# Patient Record
Sex: Male | Born: 1935
Health system: Southern US, Community
[De-identification: ages and names within clinical notes are randomized; demographics above are authoritative.]

## PROBLEM LIST (undated history)

## (undated) DIAGNOSIS — K219 Gastro-esophageal reflux disease without esophagitis: Secondary | ICD-10-CM

## (undated) DIAGNOSIS — I10 Essential (primary) hypertension: Secondary | ICD-10-CM

## (undated) HISTORY — DX: Gastro-esophageal reflux disease without esophagitis: K21.9

## (undated) HISTORY — DX: Essential (primary) hypertension: I10

## (undated) HISTORY — PX: CORONARY ARTERY BYPASS GRAFT: SHX141

---

## 2005-04-26 ENCOUNTER — Ambulatory Visit: Payer: Self-pay | Admitting: Internal Medicine

## 2012-06-27 DIAGNOSIS — I2510 Atherosclerotic heart disease of native coronary artery without angina pectoris: Secondary | ICD-10-CM | POA: Insufficient documentation

## 2013-09-06 DIAGNOSIS — N402 Nodular prostate without lower urinary tract symptoms: Secondary | ICD-10-CM | POA: Insufficient documentation

## 2013-09-06 DIAGNOSIS — H18603 Keratoconus, unspecified, bilateral: Secondary | ICD-10-CM | POA: Insufficient documentation

## 2013-09-06 DIAGNOSIS — H2510 Age-related nuclear cataract, unspecified eye: Secondary | ICD-10-CM | POA: Insufficient documentation

## 2013-12-06 ENCOUNTER — Emergency Department: Payer: Self-pay | Admitting: Emergency Medicine

## 2013-12-06 LAB — COMPREHENSIVE METABOLIC PANEL
Albumin: 3.4 g/dL (ref 3.4–5.0)
Alkaline Phosphatase: 78 U/L
Anion Gap: 6 — ABNORMAL LOW (ref 7–16)
BUN: 31 mg/dL — ABNORMAL HIGH (ref 7–18)
Bilirubin,Total: 0.4 mg/dL (ref 0.2–1.0)
Chloride: 110 mmol/L — ABNORMAL HIGH (ref 98–107)
Co2: 22 mmol/L (ref 21–32)
Creatinine: 1.57 mg/dL — ABNORMAL HIGH (ref 0.60–1.30)
Osmolality: 282 (ref 275–301)
SGOT(AST): 27 U/L (ref 15–37)
SGPT (ALT): 26 U/L (ref 12–78)
Sodium: 138 mmol/L (ref 136–145)
Total Protein: 6.9 g/dL (ref 6.4–8.2)

## 2013-12-06 LAB — CBC
HCT: 43 % (ref 40.0–52.0)
HGB: 14.7 g/dL (ref 13.0–18.0)
MCHC: 34.1 g/dL (ref 32.0–36.0)
MCV: 95 fL (ref 80–100)
Platelet: 147 10*3/uL — ABNORMAL LOW (ref 150–440)
RDW: 12.6 % (ref 11.5–14.5)
WBC: 7 10*3/uL (ref 3.8–10.6)

## 2013-12-06 LAB — TROPONIN I: Troponin-I: 0.08 ng/mL — ABNORMAL HIGH

## 2013-12-06 LAB — APTT: Activated PTT: 27.5 secs (ref 23.6–35.9)

## 2013-12-06 LAB — HEMOGLOBIN A1C: HEMOGLOBIN A1C: 5.4

## 2013-12-07 LAB — LIPID PANEL
Cholesterol: 131 mg/dL (ref 0–200)
HDL Cholesterol: 50 mg/dL (ref 40–60)
Ldl Cholesterol, Calc: 40 mg/dL (ref 0–100)
Triglycerides: 204 mg/dL — ABNORMAL HIGH (ref 0–200)
VLDL Cholesterol, Calc: 41 mg/dL — ABNORMAL HIGH (ref 5–40)

## 2014-09-09 DIAGNOSIS — N400 Enlarged prostate without lower urinary tract symptoms: Secondary | ICD-10-CM | POA: Insufficient documentation

## 2014-11-05 LAB — LIPID PANEL
CHOLESTEROL: 156 mg/dL (ref 0–200)
HDL: 51 mg/dL (ref 35–70)
LDL Cholesterol: 83 mg/dL
LDl/HDL Ratio: 1.6
Triglycerides: 111 mg/dL (ref 40–160)

## 2014-11-05 LAB — HEPATIC FUNCTION PANEL
ALK PHOS: 79 U/L (ref 25–125)
ALT: 12 U/L (ref 10–40)
AST: 17 U/L (ref 14–40)
Bilirubin, Total: 0.4 mg/dL

## 2014-11-05 LAB — PSA: PSA: 0.9

## 2014-11-05 LAB — TSH: TSH: 1.84 u[IU]/mL (ref <=5.90)

## 2015-01-15 LAB — CBC AND DIFFERENTIAL
HEMATOCRIT: 41 % (ref 41–53)
Hemoglobin: 13.6 g/dL (ref 13.5–17.5)
NEUTROS ABS: 4 /uL
Platelets: 181 10*3/uL (ref 150–399)
WBC: 6 10*3/mL

## 2015-01-16 ENCOUNTER — Ambulatory Visit: Payer: Self-pay | Admitting: Family Medicine

## 2015-01-20 LAB — BASIC METABOLIC PANEL
BUN: 23 mg/dL — AB (ref 4–21)
Creatinine: 1.8 mg/dL — AB (ref 0.6–1.3)
GLUCOSE: 101 mg/dL
Potassium: 5 mmol/L (ref 3.4–5.3)
Sodium: 140 mmol/L (ref 137–147)

## 2015-04-04 NOTE — H&P (Signed)
PATIENT NAME:  Eric Mccall, Eric Mccall MR#:  [id number] DATE OF BIRTH:  Oct 30, 1934  DATE OF ADMISSION:  12/06/2013  CHIEF COMPLAINT:  A 81 year old gentleman with chief complaint of chest pain.   REFERRING PHYSICIAN: Dorothea GlassmanPaul Malinda.   PRIMARY CARE PHYSICIAN: Dr. Julieanne Mansonichard Gilbert[facility]   HISTORY OF PRESENT ILLNESS: This is a very nice 81 year old gentleman with history of coronary artery disease, hyperlipidemia, hypertension, previous triple A operated on who comes with a history of having significant chest pain that started at 11:00 a.m. in the morning while resting. The pain lasted for 2 hours. He took 2 aspirin full strength and the pain started to ease up after 1 hour. It started as an intensity of 5/10 located in the middle of the chest, very intense for 1 hour; then after 1 hour started to get better.  But whenever the patient got up and walked around, the pain got really severe again and went away after 2 hours. The patient had radiation of the pain on both arms and it was described as crushing, heaviness in the middle of the chest, not association with sensation of nausea, cryo -diaphoresis or shortness of breath. The patient was actually having this exact same pain 12 years ago before his CABG. Whenever he had his coronary artery bypass graft, he did not have a MI. He had stable angina with exertion, and he has not had any angina since his CABG. The patient is having elevation of the J-point of 1 mm and a T wave that looks hyperacute on 3 and 4. The patient also has depression of ST segment in V5 and all these changes were improved on the second EKG. The patient is now asymptomatic, but his first troponin is positive 0.06. The patient is admitted for evaluation and treatment of acute coronary syndrome.   CONSTITUTIONAL: No fever, fatigue or weakness. EYES: No blurry vision or double vision.    ENT: No difficulty swallowing or tinnitus.  RESPIRATORY: No cough or wheezing or COPD.   CARDIOVASCULAR: No chest pain or orthopnea.  GASTROINTESTINAL: No nausea, vomiting or diarrhea.  GENITOURINARY: No dysuria or hematuria.  ENDOCRINE: No polyuria, polydipsia or polyphagia. HEMATOLOGIC AND LYMPHATIC: No anemia, easy bruising or bleeding.  SKIN: No rashes or petechiae.  MUSCULOSKELETAL: No significant neck pain or back pain.  NEUROLOGIC: No numbness or tingling.  PSYCHIATRIC: No insomnia or depression.   PAST MEDICAL HISTORY: 1.  Coronary artery disease.  2.  Hyperlipidemia.  3.  Hypertension.  4.  Aortic abdominal aneurysm, status post repair.  5.  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   ALLERGIES: No known drug allergies.   PAST SURGICAL HISTORY:  Cataract x 2, coronary artery bypass graft 12 years ago, aortic abdominal aneurysm repair.   FAMILY HISTORY: Positive for coronary artery disease and MI in his father at the age of 79. No CHF. Cancer of the breasts in his sister and his mom had some cancer that he is not aware where it was located.   SOCIAL HISTORY: He has never smoked. He does not drink alcohol. He is retired. 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   MEDICATIONS: Altace 2.5 mg daily, Zocor 40 mg daily, aspirin 81 mg daily, glucosamine as needed for knee pain occasionally.   PHYSICAL EXAMINATION: VITAL SIGNS: Blood pressure 130/68, pulse 61, respiratory rate of  15, temperature 98, O2 saturation 98% on room air.  Pulse now  is 60. GENERAL:  Alert and oriented x 3, in no acute distress. No respiratory distress. Hemodynamically stable.  HEENT: Pupils are equal and reactive. Extraocular movements are intact. Mucosa moist. Anicteric sclerae. Pink conjunctivae. No oral lesions. No oropharyngeal exudates.  NECK: Supple. No JVD. No thyromegaly. No adenopathy. No carotid bruits.  CARDIOVASCULAR: Regular rate and rhythm. No murmurs, rubs or gallops are appreciated. No displacement of PMI.  LUNGS: Clear  without any wheezing or crepitus. No use of accessory muscles.  ABDOMEN: Soft, nontender, nondistended. No hepatosplenomegaly. No masses. Bowel sounds are positive.  EXTREMITIES: No edema, cyanosis or clubbing.  VASCULAR: Pulses +2. Capillary refill less than 3.  SKIN: No rashes or petechiae.  LYMPHATIC: Negative for lymphadenopathy in the neck or supraclavicular area.  NEUROLOGIC: Cranial nerves II through XII intact. No focal findings.  PSYCHIATRIC: No agitation or depression.  LABORATORY, DIAGNOSTIC AND RADIOLOGICAL DATA RESULTS: EKG changes as mentioned above. X-ray underlying emphysema.  No edema or consolidations, although the patient does not have a history of smoking. These changes might be just related to normal body habitus.  Glucose 92, BUN 31, creatinine 1.57, sodium 138, potassium 3.8, chloride 110. LFTs within normal limits. Troponin 0.06. White count is 7, hemoglobin 14, platelet count 147.   ASSESSMENT AND PLAN: This is a 81 year old gentleman with history of coronary artery disease, hyperlipidemia, hypertension, aortic abdominal aneurysm comes with chest pain lasting for 2 hours.  1.  Acute coronary syndrome. The patient comes with chest pain, which is characteristic similar than the one that he had 12 years ago when he was diagnosed with coronary artery disease. The pain improved after aspirin. There were some EKG changes mentioned above that actually reverse after the pain was gone and his first set of troponin is slightly elevated at 0.06, happened 2 to 3 hours after the pain began. The patient is admitted for acute coronary syndrome. He is on a heparin drip. He is bradycardic in the 40s for which beta blocker will be contraindicated. The patient has been given aspirin and nitroglycerin and at this moment he is symptomatic. Again, his symptoms resolved and his EKG changes resolved. I spoke with Dr. Juliann Pares who tells me that he is going to order a catheterization for the morning  when the patient gets informed of this he says that he prefers to go to [facility], as his cardiologist is there. 2.  The patient is going to be possibly transferred from the ER. If there are no beds we will be happy to admit him and continue with the treatment with heparin, morphine, oxygen, nitroglycerin and aspirin, and monitor closely as needed. If the patient gets admitted, we can order an echocardiogram, but we will defer that to cardiology if they want to do catheterization.  3.  As far as his hypertension, it seems to be stable. Continue treatment with Altace. 4.  As far as hyperlipidemia, continue Zocor. 5.  As far as his increased creatinine, we do not have a baseline but this seems to be chronic stage II. 6.  Other medical problems seem to be stable. 7.  Deep vein thrombosis prophylaxis with a heparin drip. 8. Gastrointestinal prophylaxis. We will put him on proton pump inhibitor. We will defer treatment if the patient gets transferred to [facility].  I spent about 50 minutes with this patient.    ____________________________ Felipa Furnace, MD rsg:ce D: 12/06/2013 16:23:10 ET T: 12/06/2013 18:26:08 ET JOB#: [id number]  cc: Felipa Furnace, MD, <Dictator> Murry Diaz SANCHEZ GUTIERRE  MD ELECTRONICALLY SIGNED 12/10/2013 0:38

## 2015-06-16 IMAGING — CR DG CHEST 1V PORT
1 series · 2 of 2 positions shown · non-contrast
Comparison: None.

CLINICAL DATA: Chest pain

EXAM:
PORTABLE CHEST - 1 VIEW

[Series 1: ap · 0.17mm/px · 2 of 2 slices shown]
[im 1/2]
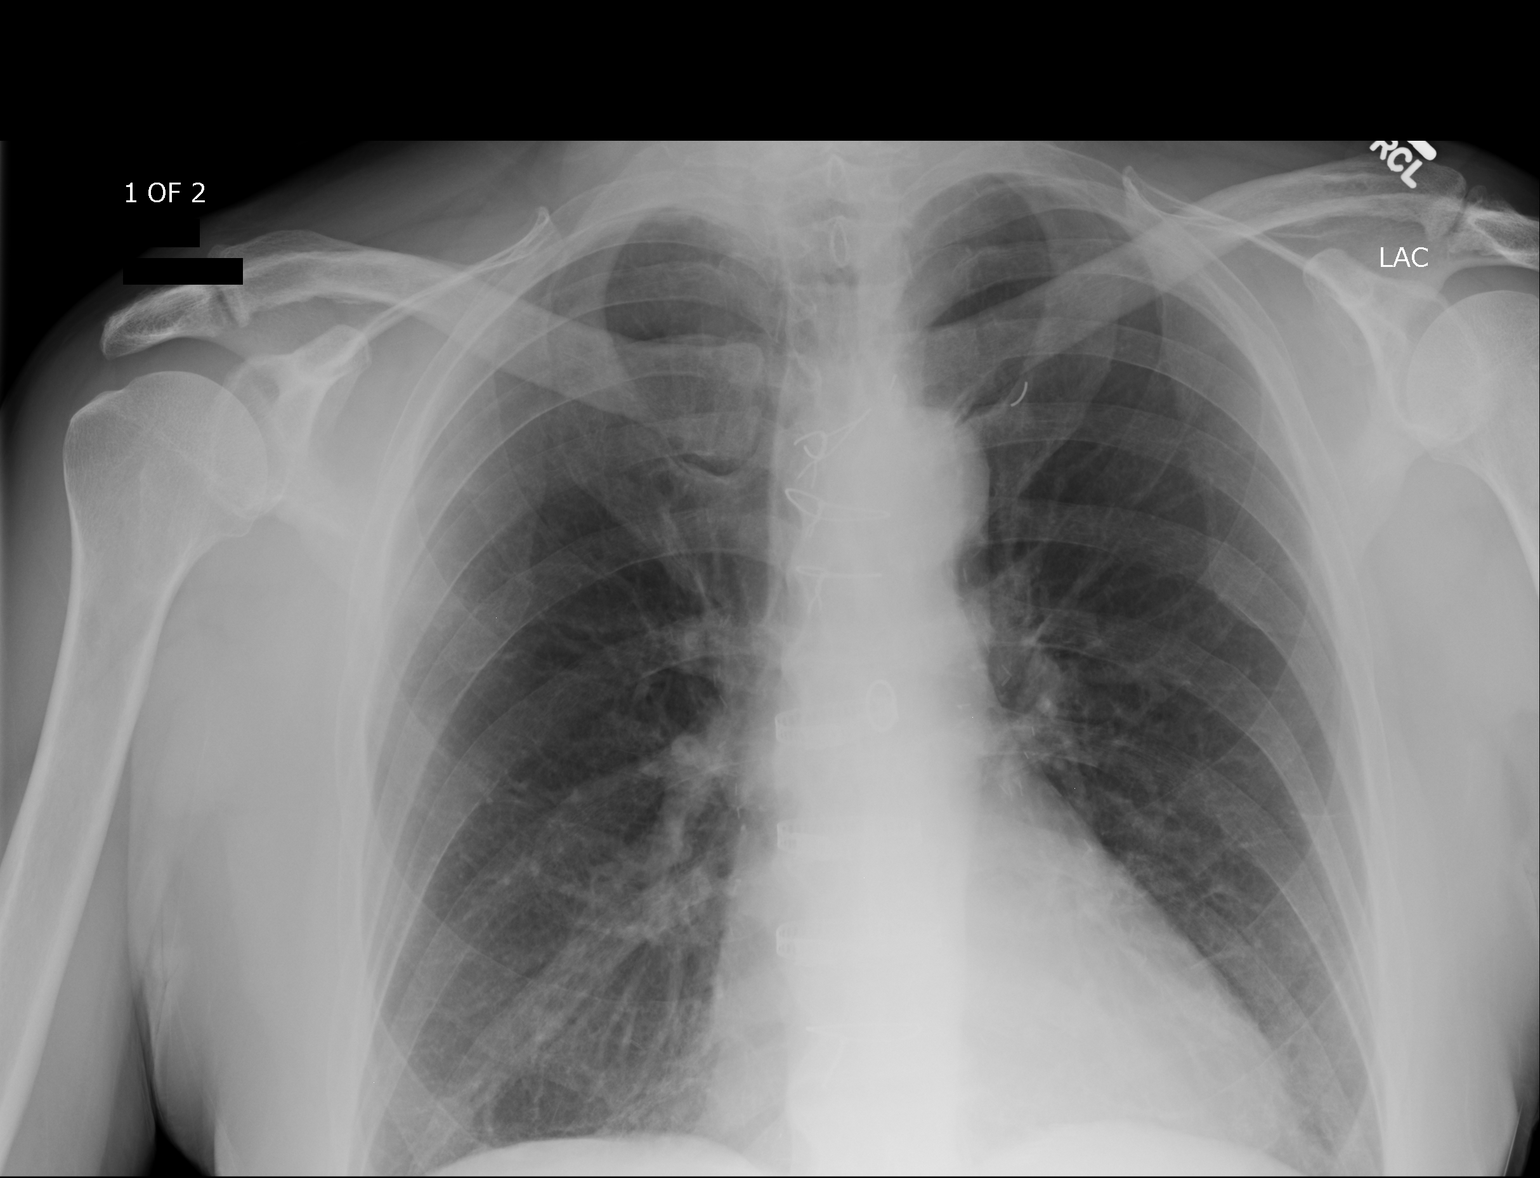
[im 2/2]
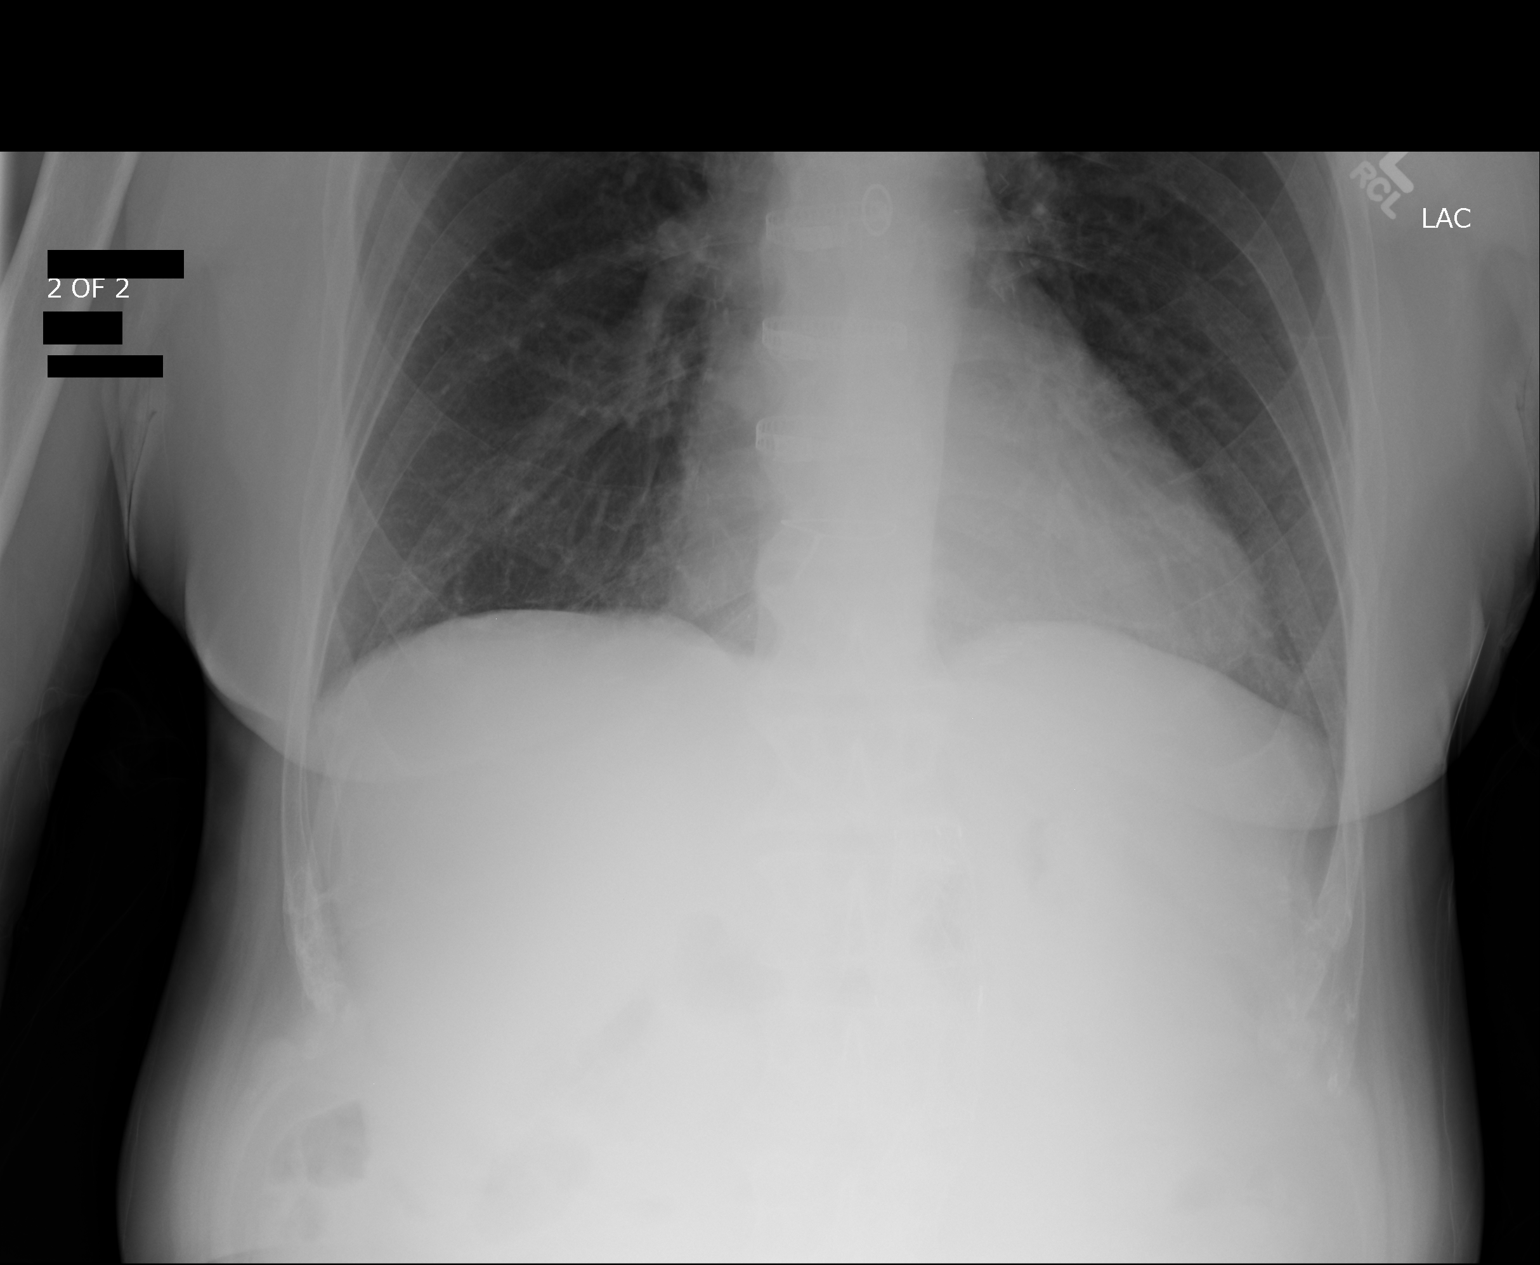

[2 of 2 positions shown; findings below may reference images not displayed]

FINDINGS: There is underlying emphysematous change. There is no edema or
consolidation. Heart is mildly enlarged. The pulmonary vascularity
reflects underlying emphysema. No adenopathy. Patient is status post
coronary artery bypass grafting. The superior most sternal wire is
fractured. No pneumothorax.
IMPRESSION: Underlying emphysema. No edema or consolidation. Mild cardiac
enlargement.

## 2015-07-07 DIAGNOSIS — N183 Chronic kidney disease, stage 3 unspecified: Secondary | ICD-10-CM | POA: Insufficient documentation

## 2015-07-07 DIAGNOSIS — I10 Essential (primary) hypertension: Secondary | ICD-10-CM | POA: Insufficient documentation

## 2015-07-07 LAB — BASIC METABOLIC PANEL
BUN: 22 mg/dL — AB (ref 4–21)
Glucose: 82 mg/dL
POTASSIUM: 4.5 mmol/L (ref 3.4–5.3)
SODIUM: 139 mmol/L (ref 137–147)

## 2015-08-06 LAB — BASIC METABOLIC PANEL: CREATININE: 2.3 mg/dL — AB (ref <=1.3)

## 2015-09-25 ENCOUNTER — Ambulatory Visit (INDEPENDENT_AMBULATORY_CARE_PROVIDER_SITE_OTHER): Payer: Medicare Other

## 2015-09-25 DIAGNOSIS — Z23 Encounter for immunization: Secondary | ICD-10-CM | POA: Diagnosis not present

## 2015-11-12 LAB — LIPID PANEL
CHOLESTEROL: 177 mg/dL (ref 0–200)
HDL: 40 mg/dL (ref 35–70)
LDL Cholesterol: 104 mg/dL
TRIGLYCERIDES: 163 mg/dL — AB (ref 40–160)

## 2015-11-13 DIAGNOSIS — I714 Abdominal aortic aneurysm, without rupture, unspecified: Secondary | ICD-10-CM | POA: Insufficient documentation

## 2015-11-13 DIAGNOSIS — IMO0002 Reserved for concepts with insufficient information to code with codable children: Secondary | ICD-10-CM | POA: Insufficient documentation

## 2015-11-13 DIAGNOSIS — E785 Hyperlipidemia, unspecified: Secondary | ICD-10-CM | POA: Insufficient documentation

## 2015-11-13 DIAGNOSIS — I259 Chronic ischemic heart disease, unspecified: Secondary | ICD-10-CM | POA: Insufficient documentation

## 2015-11-13 DIAGNOSIS — I130 Hypertensive heart and chronic kidney disease with heart failure and stage 1 through stage 4 chronic kidney disease, or unspecified chronic kidney disease: Secondary | ICD-10-CM | POA: Insufficient documentation

## 2015-11-13 DIAGNOSIS — I499 Cardiac arrhythmia, unspecified: Secondary | ICD-10-CM | POA: Insufficient documentation

## 2015-11-20 ENCOUNTER — Ambulatory Visit (INDEPENDENT_AMBULATORY_CARE_PROVIDER_SITE_OTHER): Payer: Medicare Other | Admitting: Family Medicine

## 2015-11-20 ENCOUNTER — Encounter: Payer: Self-pay | Admitting: Family Medicine

## 2015-11-20 VITALS — BP 118/60 | HR 50 | Temp 97.6°F | Resp 16 | Ht 70.0 in | Wt 171.0 lb

## 2015-11-20 DIAGNOSIS — Z0000 Encounter for general adult medical examination without abnormal findings: Secondary | ICD-10-CM | POA: Diagnosis not present

## 2015-11-20 NOTE — Progress Notes (Signed)
Patient ID: Eric Mccall, male   DOB: 03-22-1934, 81 y.o.   MRN: [id number] Patient: Eric Mccall, Male    DOB: 03-22-1934, 81 y.o.   MRN: [id number] Visit Date: 11/20/2015  Today's Provider: Megan Mansichard Katalaya Beel Jr, MD   Chief Complaint  Patient presents with  . Annual Exam   Subjective:   Eric AmberCharles L Lintner is a 81 y.o. male who presents today for his Subsequent Annual Wellness Visit. He feels well. He reports exercising. Does a lot of yard work. He reports he is sleeping well.  Colonoscopy-04/26/05 Diverticulosis EKG- 02/21/13 Td- 05/23/12 Pneumo 23- 10/21/04 Prevnar- 11/05/14 Zoster- 08/26/11  Review of Systems  Constitutional: Negative.   HENT: Negative.   Eyes: Negative.   Respiratory: Negative.   Cardiovascular: Negative.   Gastrointestinal: Negative.   Endocrine: Negative.   Genitourinary: Negative.   Musculoskeletal: Negative.   Skin: Negative.   Allergic/Immunologic: Negative.   Neurological: Negative.   Hematological: Negative.   Psychiatric/Behavioral: Negative.     Patient Active Problem List   Diagnosis Date Noted  . Abdominal aortic aneurysm (AAA) (HCC) 11/13/2015  . HLD (hyperlipidemia) 11/13/2015  . Heart & renal disease, hypertensive, with heart failure (HCC) 11/13/2015  . Irregular cardiac rhythm 11/13/2015  . Cardiac ischemia 11/13/2015  . Cataract, nuclear 11/13/2015  . Chronic kidney disease (CKD), stage III (moderate) 07/07/2015  . Essential (primary) hypertension 07/07/2015  . Benign fibroma of prostate 09/09/2014  . NS (nuclear sclerosis) 09/06/2013  . Arteriosclerosis of coronary artery 06/27/2012    Social History   Social History  . Marital Status: Married    Spouse Name: N/A  . Number of Children: N/A  . Years of Education: N/A   Occupational History  . Not on file.   Social History Main Topics  . Smoking status: Not on file  . Smokeless tobacco: Not on file  . Alcohol Use: Not on file  . Drug Use: Not on file  . Sexual  Activity: Not on file   Other Topics Concern  . Not on file   Social History Narrative  . No narrative on file    Past Surgical History  Procedure Laterality Date  . Coronary artery bypass graft      His family history includes Breast cancer in his sister; Cancer in his mother; Depression in his mother; Heart attack in his father; Heart disease in his father.    Outpatient Prescriptions Prior to Visit  Medication Sig Dispense Refill  . amLODipine (NORVASC) 5 MG tablet Take by mouth.    Marland Kitchen. aspirin 81 MG tablet Take by mouth.    . chlorproMAZINE (THORAZINE) 10 MG tablet Take by mouth.    . clopidogrel (PLAVIX) 75 MG tablet Take by mouth.    . metoprolol succinate (TOPROL-XL) 25 MG 24 hr tablet Take by mouth.    . mometasone (ELOCON) 0.1 % cream MOMETASONE FUROATE, 0.1% (External Cream)  1 (one) Cream Cream apply to affected area daily for 0 days  Quantity: 1;  Refills: 1   Ordered :05-Nov-2014  Dimas ChyleByrd, Brittany ;  Started 05-Nov-2014 Active    . omeprazole (PRILOSEC) 20 MG capsule Take by mouth.    . pantoprazole (PROTONIX) 40 MG tablet Take by mouth.    . ramipril (ALTACE) 2.5 MG capsule Take by mouth.    . simvastatin (ZOCOR) 40 MG tablet Take by mouth.     No facility-administered medications prior to visit.    No Known Allergies  Patient Care Team: Maple Hudsonichard L Miquel Stacks Jr., MD  as PCP - General (Family Medicine)  Objective:   Vitals: There were no vitals filed for this visit.  Physical Exam  Constitutional: He is oriented to person, place, and time. He appears well-developed and well-nourished.  HENT:  Head: Normocephalic and atraumatic.  Right Ear: External ear normal.  Left Ear: External ear normal.  Nose: Nose normal.  Mouth/Throat: Oropharynx is clear and moist.  Eyes: Conjunctivae and EOM are normal. Pupils are equal, round, and reactive to light.  Neck: Normal range of motion. Neck supple.  Cardiovascular: Normal rate, regular rhythm, normal heart sounds and  intact distal pulses.   Pulmonary/Chest: Effort normal and breath sounds normal.  Abdominal: Soft. Bowel sounds are normal.  Genitourinary:  Deferred to urology  Musculoskeletal: Normal range of motion.  Neurological: He is alert and oriented to person, place, and time. He has normal reflexes.  Skin: Skin is warm and dry.  Psychiatric: He has a normal mood and affect. His behavior is normal. Judgment and thought content normal.    Activities of Daily Living No flowsheet data found.  Fall Risk Assessment No flowsheet data found.   Depression Screen No flowsheet data found.  Cognitive Testing - 6-CIT    Year: 0 points  Month: 0 points  Memorize "Floyde Parkins, [address] [place]"  Time (within 1 hour:) 0 points  Count backwards from 20: 0 points  Name months of year: 0 points  Repeat Address: 2 points   Total Score: 2/28  Interpretation : Normal (0-7) Abnormal (8-28)    Assessment & Plan:     Annual Wellness Visit  Reviewed patient's Family Medical History Reviewed and updated list of patient's medical providers Assessment of cognitive impairment was done Assessed patient's functional ability Established a written schedule for health screening services Health Risk Assessent Completed and Reviewed  Exercise Activities and Dietary recommendations Goals    None      Immunization History  Administered Date(s) Administered  . Influenza, High Dose Seasonal PF 09/25/2015  . Pneumococcal Conjugate-13 11/05/2014  . Pneumococcal Polysaccharide-23 10/21/2004  . Td 05/23/2012  . Zoster 08/26/2011    Health Maintenance  Topic Date Due  . INFLUENZA VACCINE  07/13/2016  . TETANUS/TDAP  05/23/2022  . ZOSTAVAX  Completed  . PNA vac Low Risk Adult  Completed      Discussed health benefits of physical activity, and encouraged him to engage in regular exercise appropriate for his age and condition.    Julieanne Manson MD [facility] 11/20/2015 8:27 AM  ------------------------------------------------------------------------------------------------------------

## 2015-11-21 ENCOUNTER — Encounter: Payer: Self-pay | Admitting: Family Medicine

## 2016-03-05 ENCOUNTER — Other Ambulatory Visit: Payer: Self-pay

## 2016-03-05 NOTE — Telephone Encounter (Signed)
Refill request from [facility] came through for Omeprazole but an adverse reaction is coming up with Plavix when i try to send it. Please review-aa

## 2016-03-06 MED ORDER — OMEPRAZOLE 20 MG PO CPDR: 20.0000 mg | DELAYED_RELEASE_CAPSULE | Freq: Every day | ORAL | Status: DC

## 2016-05-04 ENCOUNTER — Other Ambulatory Visit: Payer: Self-pay | Admitting: Family Medicine

## 2016-05-12 DIAGNOSIS — I2510 Atherosclerotic heart disease of native coronary artery without angina pectoris: Secondary | ICD-10-CM | POA: Diagnosis not present

## 2016-05-12 DIAGNOSIS — E782 Mixed hyperlipidemia: Secondary | ICD-10-CM | POA: Diagnosis not present

## 2016-05-12 DIAGNOSIS — I10 Essential (primary) hypertension: Secondary | ICD-10-CM | POA: Diagnosis not present

## 2016-05-24 ENCOUNTER — Ambulatory Visit: Payer: Medicare Other | Admitting: Family Medicine

## 2016-05-24 ENCOUNTER — Ambulatory Visit (INDEPENDENT_AMBULATORY_CARE_PROVIDER_SITE_OTHER): Payer: Medicare Other | Admitting: Family Medicine

## 2016-05-24 VITALS — BP 118/72 | HR 56 | Temp 98.3°F | Resp 14 | Wt 172.0 lb

## 2016-05-24 DIAGNOSIS — I10 Essential (primary) hypertension: Secondary | ICD-10-CM

## 2016-05-24 DIAGNOSIS — E785 Hyperlipidemia, unspecified: Secondary | ICD-10-CM

## 2016-05-24 DIAGNOSIS — I2510 Atherosclerotic heart disease of native coronary artery without angina pectoris: Secondary | ICD-10-CM

## 2016-05-24 DIAGNOSIS — N183 Chronic kidney disease, stage 3 unspecified: Secondary | ICD-10-CM

## 2016-05-24 DIAGNOSIS — R42 Dizziness and giddiness: Secondary | ICD-10-CM | POA: Diagnosis not present

## 2016-05-24 MED ORDER — ATORVASTATIN CALCIUM 40 MG PO TABS: 40.0000 mg | ORAL_TABLET | Freq: Every day | ORAL | Status: DC

## 2016-05-24 NOTE — Progress Notes (Signed)
Patient ID: Eric Mccall, male   DOB: [birth year], 82 y.o.   MRN: [id number]    Subjective:  HPI  Patient is here for follow up.  Hypertension: patient checks her b/p at times and readings are usually around 120s/80s. No cardiac symptoms. BP Readings from Last 3 Encounters:  05/24/16 118/72  11/20/15 118/60  01/20/15 130/72   Hyperlipidemia: His cardiologist switched Zocor to Crestor about 10 days ago, and after that he has noticed that he feels lightheaded all the time, slightly unsteady. Lab Results  Component Value Date   CHOL 177 11/12/2015   HDL 40 11/12/2015   LDLCALC 104 11/12/2015   TRIG 163* 11/12/2015   GERD: takes Omeprazole 1 tablet daily.  Prior to Admission medications   Medication Sig Start Date End Date Taking? Authorizing Provider  amLODipine (NORVASC) 5 MG tablet Take by mouth.    Historical Provider, MD  aspirin 81 MG tablet Take by mouth.    Historical Provider, MD  chlorproMAZINE (THORAZINE) 10 MG tablet Take by mouth. 01/15/15   Historical Provider, MD  clopidogrel (PLAVIX) 75 MG tablet Take by mouth.    Historical Provider, MD  metoprolol succinate (TOPROL-XL) 25 MG 24 hr tablet Take by mouth.    Historical Provider, MD  mometasone (ELOCON) 0.1 % cream APPLY TO AFFECTED AREA DAILY 05/04/16   Lorie Phenix, MD  omeprazole (PRILOSEC) 20 MG capsule Take 1 capsule (20 mg total) by mouth daily. 03/06/16   Hollan Philipp Hulen Shouts., MD  pantoprazole (PROTONIX) 40 MG tablet Take by mouth. 01/15/15   Historical Provider, MD  ramipril (ALTACE) 2.5 MG capsule Take by mouth. 03/13/13   Historical Provider, MD  simvastatin (ZOCOR) 40 MG tablet Take by mouth. 03/19/13   Historical Provider, MD    Patient Active Problem List   Diagnosis Date Noted  . Abdominal aortic aneurysm (AAA) (HCC) 11/13/2015  . HLD (hyperlipidemia) 11/13/2015  . Heart & renal disease, hypertensive, with heart failure (HCC) 11/13/2015  . Irregular cardiac rhythm 11/13/2015  . Cardiac ischemia 11/13/2015   . Cataract, nuclear 11/13/2015  . Chronic kidney disease (CKD), stage III (moderate) 07/07/2015  . Essential (primary) hypertension 07/07/2015  . Benign fibroma of prostate 09/09/2014  . NS (nuclear sclerosis) 09/06/2013  . Arteriosclerosis of coronary artery 06/27/2012    No past medical history on file.  Social History   Social History  . Marital Status: Married    Spouse Name: N/A  . Number of Children: N/A  . Years of Education: N/A   Occupational History  . Not on file.   Social History Main Topics  . Smoking status: Never Smoker   . Smokeless tobacco: Not on file  . Alcohol Use: No  . Drug Use: No  . Sexual Activity: Not on file   Other Topics Concern  . Not on file   Social History Narrative    No Known Allergies  Review of Systems  Constitutional: Negative.   HENT: Negative.   Eyes: Negative.   Respiratory: Negative.   Cardiovascular: Negative.   Gastrointestinal: Negative.   Musculoskeletal: Negative.   Skin: Negative.   Neurological: Positive for dizziness.  Endo/Heme/Allergies: Negative.   Psychiatric/Behavioral: Negative.     Immunization History  Administered Date(s) Administered  . Influenza, High Dose Seasonal PF 09/25/2015  . Pneumococcal Conjugate-13 11/05/2014  . Pneumococcal Polysaccharide-23 10/21/2004  . Td 05/23/2012  . Zoster 08/26/2011   Objective:  BP 118/72 mmHg  Pulse 56  Temp(Src) 98.3 F (36.8 C)  Resp 14  Wt 172 lb (78.019 kg)  Physical Exam  Constitutional: He is oriented to person, place, and time and well-developed, well-nourished, and in no distress.  HENT:  Head: Normocephalic and atraumatic.  Right Ear: External ear normal.  Left Ear: External ear normal.  Nose: Nose normal.  Mouth/Throat: Oropharynx is clear and moist.  Eyes: Conjunctivae are normal. Pupils are equal, round, and reactive to light.  Neck: Normal range of motion. Neck supple.  Cardiovascular: Normal rate, regular rhythm, normal heart  sounds and intact distal pulses.   No murmur heard. Pulmonary/Chest: Effort normal and breath sounds normal. No respiratory distress. He has no wheezes.  Abdominal: Soft.  Neurological: He is alert and oriented to person, place, and time. No cranial nerve deficit. Gait normal. Coordination normal.  Skin: Skin is warm and dry.  Psychiatric: Mood, memory, affect and judgment normal.    Lab Results  Component Value Date   WBC 6.0 01/15/2015   HGB 13.6 01/15/2015   HCT 41 01/15/2015   PLT 181 01/15/2015   GLUCOSE 92 12/06/2013   CHOL 177 11/12/2015   TRIG 163* 11/12/2015   HDL 40 11/12/2015   LDLCALC 104 11/12/2015   TSH 1.84 11/05/2014   PSA 0.9 11/05/2014   HGBA1C 5.4 12/06/2013    CMP     Component Value Date/Time   NA 139 07/07/2015   NA 138 12/06/2013 1403   K 4.5 07/07/2015   K 4.8 12/06/2013 1403   CL 110* 12/06/2013 1403   CO2 22 12/06/2013 1403   GLUCOSE 92 12/06/2013 1403   BUN 22* 07/07/2015   BUN 31* 12/06/2013 1403   CREATININE 2.3* 08/06/2015   CREATININE 1.57* 12/06/2013 1403   CALCIUM 8.8 12/06/2013 1403   PROT 6.9 12/06/2013 1403   ALBUMIN 3.4 12/06/2013 1403   AST 17 11/05/2014   AST 27 12/06/2013 1403   ALT 12 11/05/2014   ALT 26 12/06/2013 1403   ALKPHOS 79 11/05/2014   ALKPHOS 78 12/06/2013 1403   BILITOT 0.4 12/06/2013 1403   GFRNONAA 42* 12/06/2013 1403   GFRAA 49* 12/06/2013 1403    Assessment and Plan :  1. Essential (primary) hypertension Stable continue current medication.  2. Chronic kidney disease (CKD), stage III (moderate)  3. HLD (hyperlipidemia) After switching medication patient has had lightheadedness and this could be the issue, will try Lipitor in about 7 to 10 days after stopping Crestor.   4. Lightheaded Stop Crestor and see if symptoms resolve, and will switch to Lipitor after about 1 week if symptoms are coming from Crestor. RX printed for Lipitor so he can fill it if needed. If symptoms do not improve will need  further work up then. Pt definitely feels this is side effect and not orthostases or neurologic issue. 5. CAD All risk factors treated. Patient was seen and examined by Dr. Bosie Closichard L Kaeson Kleinert and note was scribed by Samara DeistAnastasiya Aleksandrova, RMA.  Julieanne Mansonichard Wava Kildow MD [facility] 05/24/2016 3:15 PM

## 2016-06-04 DIAGNOSIS — Z7982 Long term (current) use of aspirin: Secondary | ICD-10-CM | POA: Diagnosis not present

## 2016-06-04 DIAGNOSIS — Z09 Encounter for follow-up examination after completed treatment for conditions other than malignant neoplasm: Secondary | ICD-10-CM | POA: Diagnosis not present

## 2016-06-04 DIAGNOSIS — K598 Other specified functional intestinal disorders: Secondary | ICD-10-CM | POA: Diagnosis not present

## 2016-06-04 DIAGNOSIS — Z79899 Other long term (current) drug therapy: Secondary | ICD-10-CM | POA: Diagnosis not present

## 2016-06-04 DIAGNOSIS — Z8679 Personal history of other diseases of the circulatory system: Secondary | ICD-10-CM | POA: Diagnosis not present

## 2016-06-04 DIAGNOSIS — R1909 Other intra-abdominal and pelvic swelling, mass and lump: Secondary | ICD-10-CM | POA: Diagnosis not present

## 2016-06-04 DIAGNOSIS — I714 Abdominal aortic aneurysm, without rupture: Secondary | ICD-10-CM | POA: Diagnosis not present

## 2016-06-14 DIAGNOSIS — N183 Chronic kidney disease, stage 3 (moderate): Secondary | ICD-10-CM | POA: Diagnosis not present

## 2016-06-14 DIAGNOSIS — I10 Essential (primary) hypertension: Secondary | ICD-10-CM | POA: Diagnosis not present

## 2016-07-26 IMAGING — CR DG CHEST 2V
1 series · 2 of 2 positions shown · non-contrast
Comparison: 12/06/2013

CLINICAL DATA: Hiccups for 8 days, cough for 4-5 days, 4 vessel
CABG 12 years ago

EXAM:
CHEST  2 VIEW

[Series 1: dxr chest pa (or ap) and lateral · 0.14mm/px · 2 of 2 slices shown]
[im 1/2]
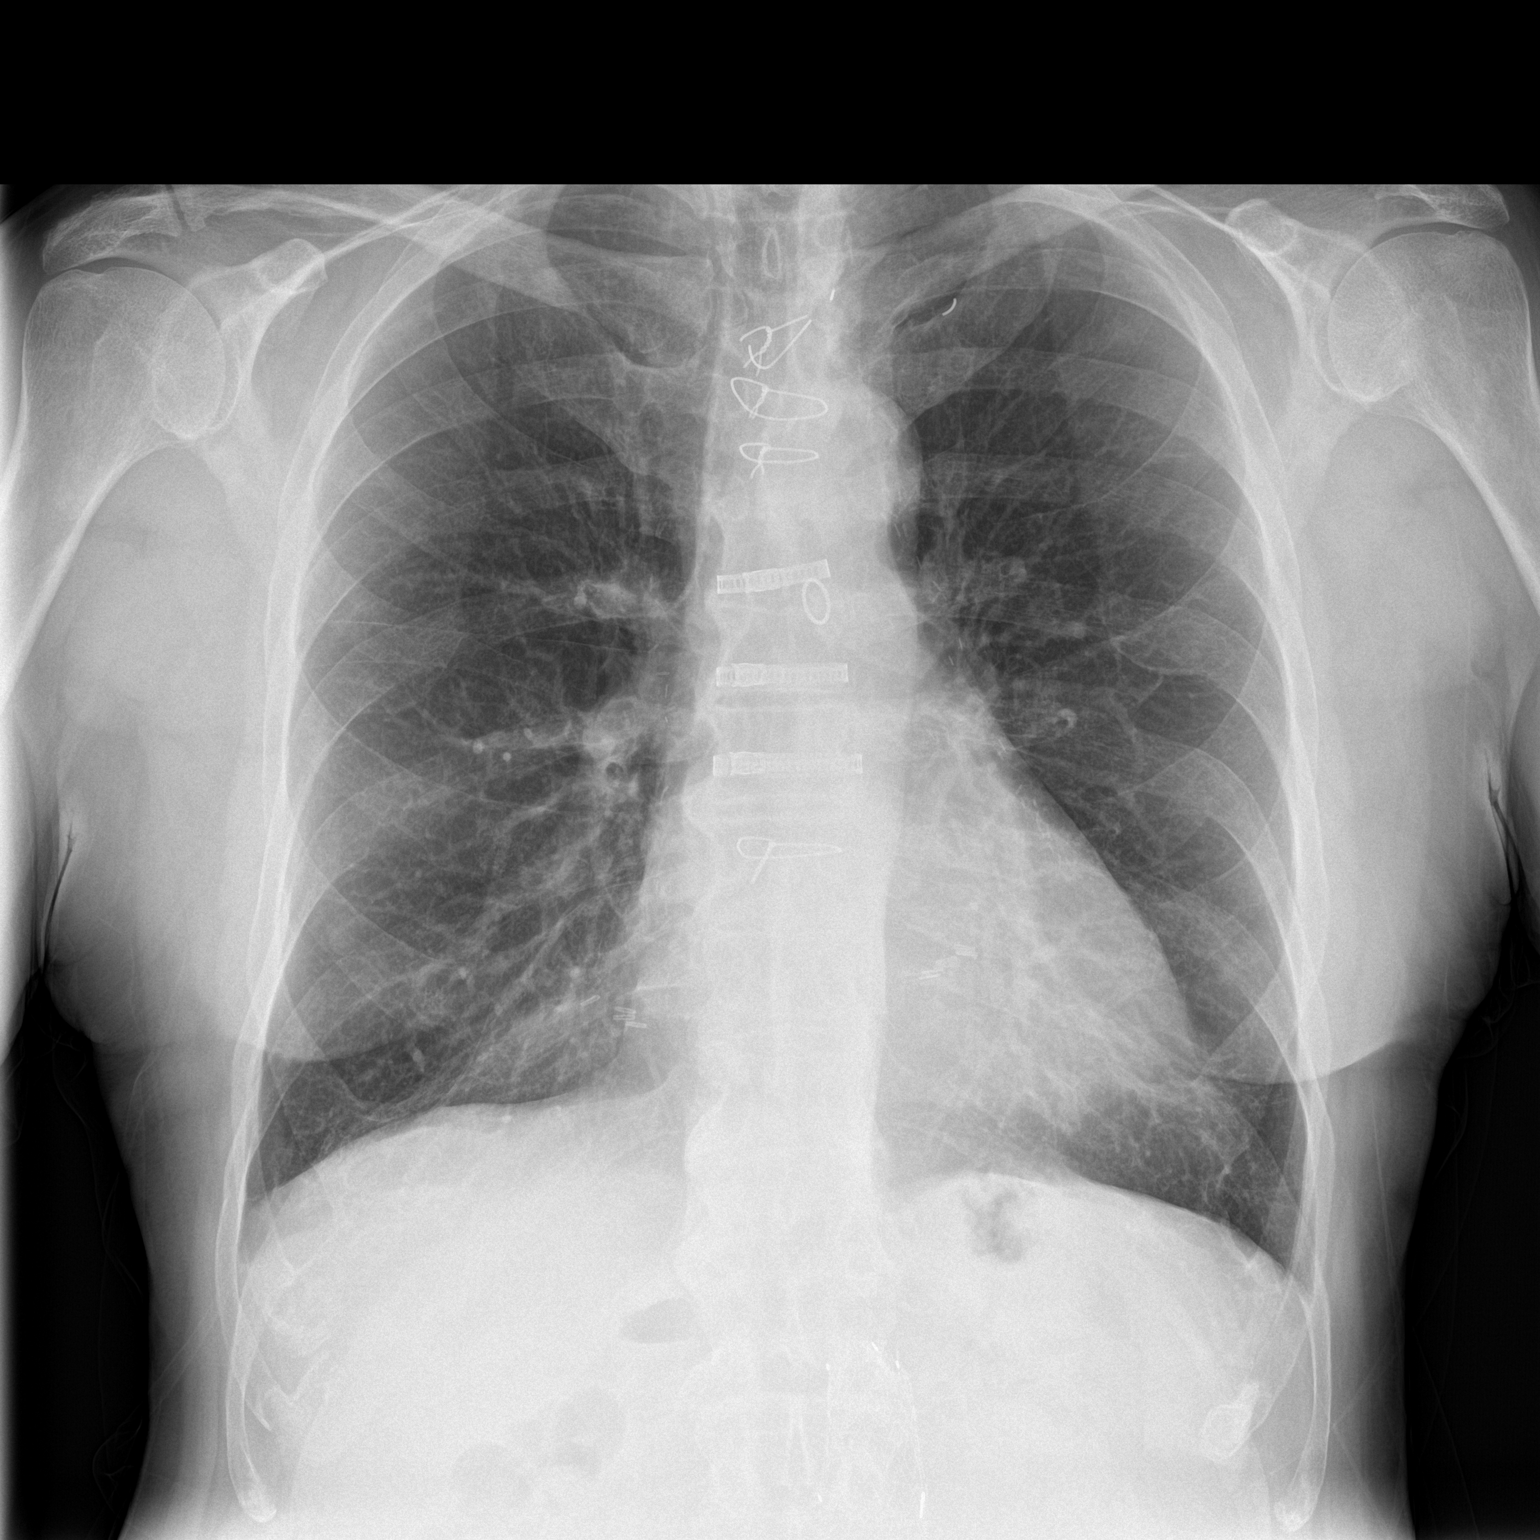
[im 2/2]
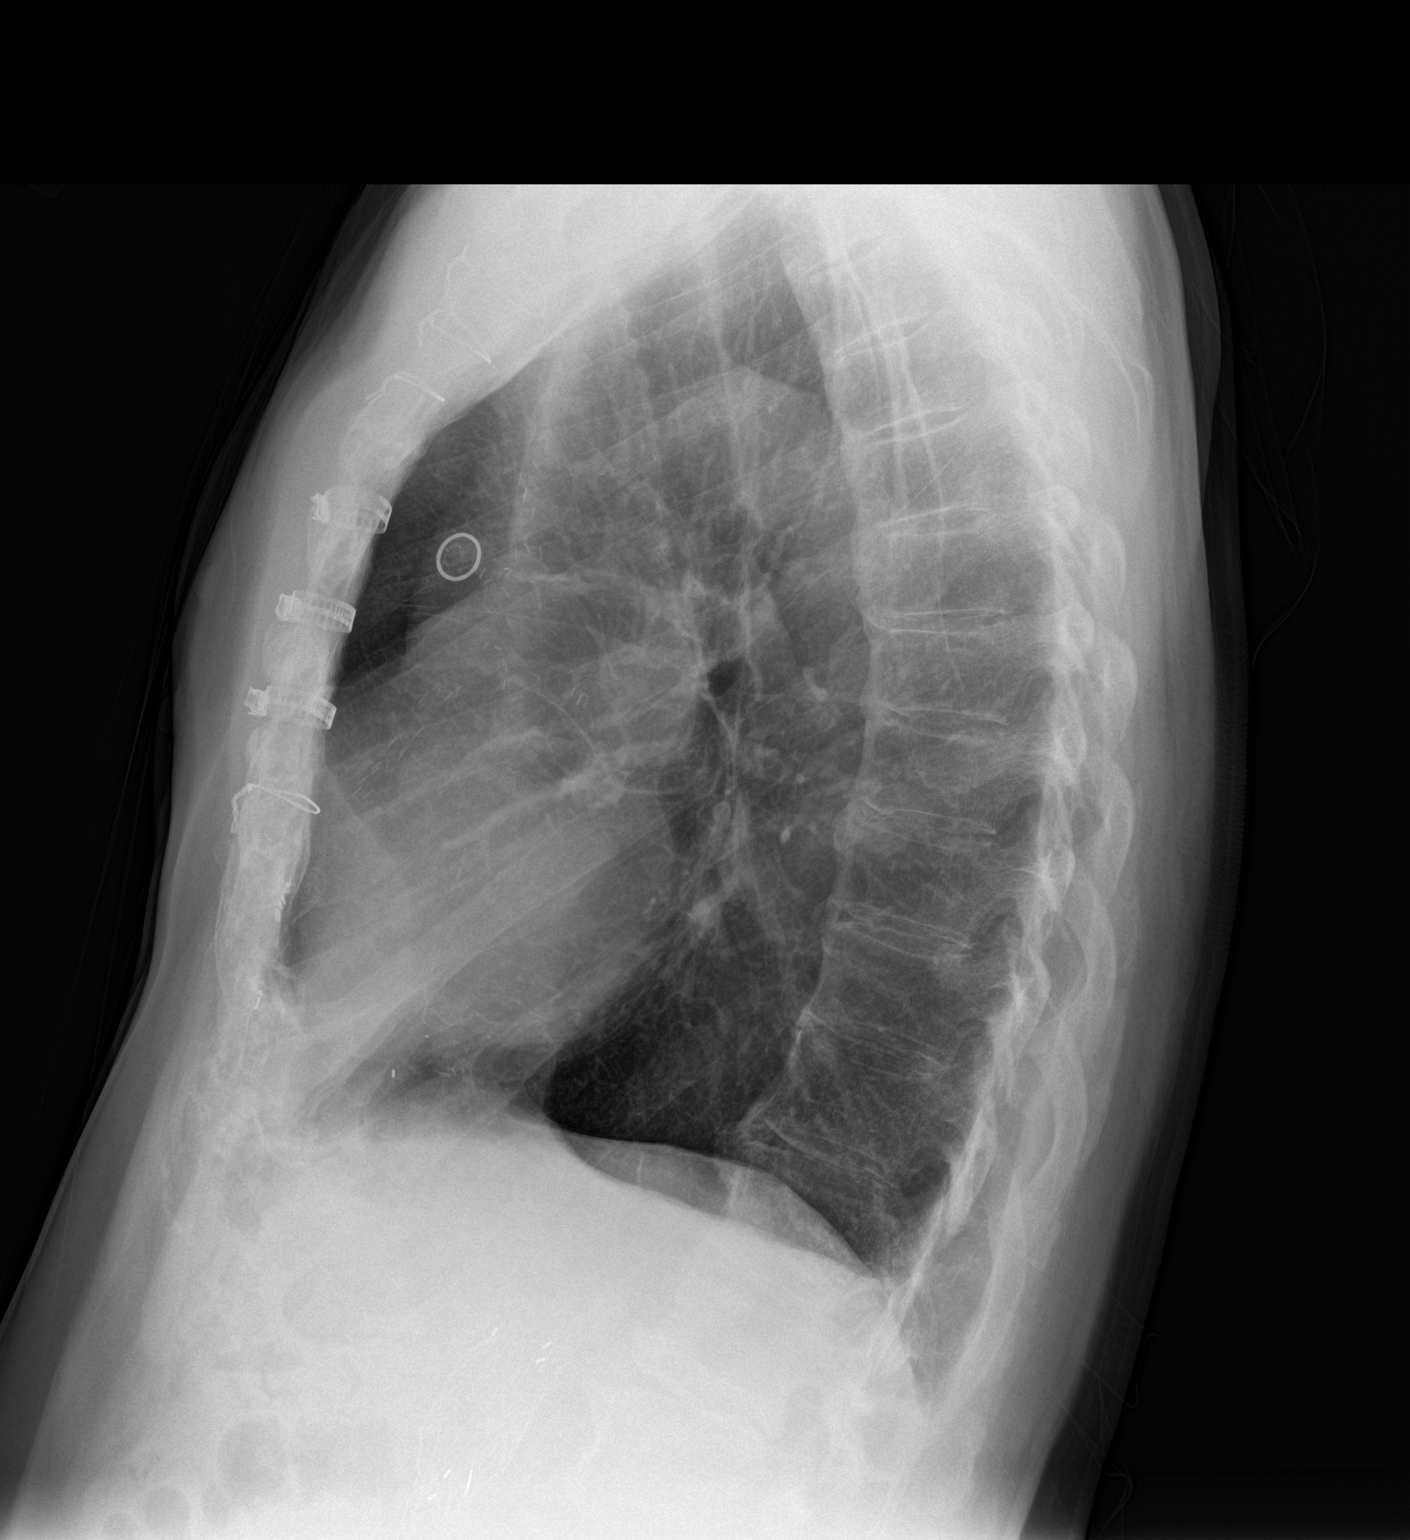

[2 of 2 positions shown; findings below may reference images not displayed]

FINDINGS: Upper normal heart size post CABG.

Minimal atherosclerotic calcification aortic arch.

Mediastinal contours and pulmonary vascularity normal.

Emphysematous and minimal bronchitic changes consistent with COPD.

No acute infiltrate, pleural effusion or pneumothorax.

Diffuse idiopathic skeletal hyperostosis of the thoracic spine.

Diffuse osseous demineralization with BILATERAL AC joint
degenerative changes.

Incidentally noted abdominal aortic stent graft.
IMPRESSION: Post CABG.

COPD changes.

No acute abnormalities.

## 2016-07-28 DIAGNOSIS — I10 Essential (primary) hypertension: Secondary | ICD-10-CM | POA: Diagnosis not present

## 2016-07-28 DIAGNOSIS — I2510 Atherosclerotic heart disease of native coronary artery without angina pectoris: Secondary | ICD-10-CM | POA: Diagnosis not present

## 2016-09-30 ENCOUNTER — Ambulatory Visit (INDEPENDENT_AMBULATORY_CARE_PROVIDER_SITE_OTHER): Payer: Medicare Other

## 2016-09-30 DIAGNOSIS — Z23 Encounter for immunization: Secondary | ICD-10-CM

## 2016-11-12 DIAGNOSIS — I10 Essential (primary) hypertension: Secondary | ICD-10-CM | POA: Diagnosis not present

## 2016-11-12 DIAGNOSIS — I2510 Atherosclerotic heart disease of native coronary artery without angina pectoris: Secondary | ICD-10-CM | POA: Diagnosis not present

## 2016-11-24 ENCOUNTER — Encounter: Payer: Self-pay | Admitting: Family Medicine

## 2016-11-24 ENCOUNTER — Ambulatory Visit (INDEPENDENT_AMBULATORY_CARE_PROVIDER_SITE_OTHER): Payer: Medicare Other | Admitting: Family Medicine

## 2016-11-24 VITALS — BP 112/64 | HR 60 | Temp 97.8°F | Resp 14 | Ht 70.0 in | Wt 171.0 lb

## 2016-11-24 DIAGNOSIS — E784 Other hyperlipidemia: Secondary | ICD-10-CM

## 2016-11-24 DIAGNOSIS — I10 Essential (primary) hypertension: Secondary | ICD-10-CM

## 2016-11-24 DIAGNOSIS — E7849 Other hyperlipidemia: Secondary | ICD-10-CM

## 2016-11-24 DIAGNOSIS — Z0000 Encounter for general adult medical examination without abnormal findings: Secondary | ICD-10-CM | POA: Diagnosis not present

## 2016-11-24 DIAGNOSIS — N183 Chronic kidney disease, stage 3 unspecified: Secondary | ICD-10-CM

## 2016-11-24 DIAGNOSIS — Z8669 Personal history of other diseases of the nervous system and sense organs: Secondary | ICD-10-CM | POA: Diagnosis not present

## 2016-11-24 DIAGNOSIS — Z1211 Encounter for screening for malignant neoplasm of colon: Secondary | ICD-10-CM | POA: Diagnosis not present

## 2016-11-24 DIAGNOSIS — E781 Pure hyperglyceridemia: Secondary | ICD-10-CM | POA: Diagnosis not present

## 2016-11-24 DIAGNOSIS — I2510 Atherosclerotic heart disease of native coronary artery without angina pectoris: Secondary | ICD-10-CM

## 2016-11-24 LAB — IFOBT (OCCULT BLOOD): IMMUNOLOGICAL FECAL OCCULT BLOOD TEST: NEGATIVE

## 2016-11-24 NOTE — Progress Notes (Signed)
Patient: Eric Mccall, Male    DOB: March 28, 1934, 82 y.o.   MRN: [id number] Visit Date: 11/24/2016  Today's Provider: Megan Mansichard Gilbert Jr, MD   Chief Complaint  Patient presents with  . Medicare Wellness   Subjective:   Eric Mccall is a 82 y.o. male who presents today for his Subsequent Annual Wellness Visit. He feels well. He reports exercising walking daily and does 50 push ups every other day. He reports he is sleeping well. He is doing very well. He has wife are going on a cruise next year because from [place] to [place]. He has no significant physical complaints. He does need follow-up lab work. His heart disease is followed at [facility]. He does have some occasional flare of internal hemorrhoids. He needs an eye referral to [facility]mology Immunization History  Administered Date(s) Administered  . Influenza, High Dose Seasonal PF 09/25/2015, 09/30/2016  . Pneumococcal Conjugate-13 11/05/2014  . Pneumococcal Polysaccharide-23 10/21/2004  . Td 05/23/2012  . Zoster 08/26/2011   Last colonoscopy 04/26/05 diverticulosis  Review of Systems  Constitutional: Negative.   HENT: Negative.   Eyes: Negative.   Respiratory: Negative.   Cardiovascular: Negative.   Gastrointestinal: Negative.   Endocrine: Negative.  Negative for cold intolerance.  Genitourinary: Negative.   Musculoskeletal: Negative.   Skin: Positive for rash.  Allergic/Immunologic: Negative.   Neurological: Negative.   Hematological: Negative.   Psychiatric/Behavioral: Negative.     Patient Active Problem List   Diagnosis Date Noted  . Abdominal aortic aneurysm (AAA) (HCC) 11/13/2015  . HLD (hyperlipidemia) 11/13/2015  . Heart & renal disease, hypertensive, with heart failure (HCC) 11/13/2015  . Irregular cardiac rhythm 11/13/2015  . Cardiac ischemia 11/13/2015  . Cataract, nuclear 11/13/2015  . Chronic kidney disease (CKD), stage III (moderate) 07/07/2015  . Essential (primary) hypertension  07/07/2015  . Benign fibroma of prostate 09/09/2014  . NS (nuclear sclerosis) 09/06/2013  . Arteriosclerosis of coronary artery 06/27/2012    Social History   Social History  . Marital status: Married    Spouse name: N/A  . Number of children: N/A  . Years of education: N/A   Occupational History  . Not on file.   Social History Main Topics  . Smoking status: Never Smoker  . Smokeless tobacco: Never Used  . Alcohol use Yes     Comment: monthly or less, 1 to 2 drinks at a time then  . Drug use: No  . Sexual activity: Not on file   Other Topics Concern  . Not on file   Social History Narrative  . No narrative on file    Past Surgical History:  Procedure Laterality Date  . CORONARY ARTERY BYPASS GRAFT      His family history includes Breast cancer in his sister; Cancer in his mother; Depression in his mother; Heart attack in his father; Heart disease in his father.     Outpatient Encounter Prescriptions as of 11/24/2016  Medication Sig Note  . amLODipine (NORVASC) 5 MG tablet Take by mouth. 11/13/2015: Received from: [facility]  . aspirin 81 MG tablet Take by mouth. 11/13/2015: Received from: [facility]  . atorvastatin (LIPITOR) 40 MG tablet Take 1 tablet (40 mg total) by mouth daily.   . clopidogrel (PLAVIX) 75 MG tablet Take by mouth. 11/13/2015: Received from: [facility]  . metoprolol succinate (TOPROL-XL) 25 MG 24 hr tablet Take by mouth. 11/13/2015: Received from: [facility]  . omeprazole (PRILOSEC) 20 MG capsule Take 1  capsule (20 mg total) by mouth daily.   . ramipril (ALTACE) 2.5 MG capsule Take by mouth. 11/13/2015: Received from: [facility]   No facility-administered encounter medications on file as of 11/24/2016.     No Known Allergies  Patient Care Team: Maple Hudsonichard L Gilbert Jr., MD as PCP - General (Family Medicine)   Objective:   Vitals:  Vitals:   11/24/16  0908  BP: 112/64  Pulse: 60  Resp: 14  Temp: 97.8 F (36.6 C)  Weight: 171 lb (77.6 kg)  Height: 5\' 10"  (1.778 m)    Physical Exam  Constitutional: He is oriented to person, place, and time. He appears well-developed and well-nourished.  HENT:  Head: Normocephalic and atraumatic.  Right Ear: External ear normal.  Left Ear: External ear normal.  Mouth/Throat: Oropharynx is clear and moist.  Eyes: Conjunctivae are normal. Pupils are equal, round, and reactive to light.  He has a long skin tag in his right mid upper eyelid  Neck: Normal range of motion. Neck supple.  Cardiovascular: Normal rate, regular rhythm, normal heart sounds and intact distal pulses.   No murmur heard. Pulmonary/Chest: Effort normal and breath sounds normal. No respiratory distress. He has no wheezes.  Abdominal: Soft. He exhibits no distension. There is no tenderness.  Genitourinary: Rectum normal, prostate normal and penis normal. Rectal exam shows guaiac negative stool. No penile tenderness.  Genitourinary Comments: Small area of prostate nodule. Pt  says he has had this for years  Musculoskeletal: He exhibits no edema or tenderness.  Neurological: He is alert and oriented to person, place, and time.  Skin: Skin is warm and dry. No rash noted. No erythema.  Psychiatric: He has a normal mood and affect. His behavior is normal. Judgment and thought content normal.    Activities of Daily Living In your present state of health, do you have any difficulty performing the following activities: 11/24/2016  Hearing? N  Vision? Y  Difficulty concentrating or making decisions? N  Walking or climbing stairs? N  Dressing or bathing? N  Doing errands, shopping? N  Some recent data might be hidden    Fall Risk Assessment Fall Risk  11/24/2016 11/20/2015  Falls in the past year? No No     Depression Screen PHQ 2/9 Scores 11/24/2016 11/20/2015  PHQ - 2 Score 0 0    Cognitive Testing - 6-CIT    Year: 0 4  points  Month: 0 3 points  Memorize "Floyde ParkinsJohn, [place], [address], [address], [place]"  Time (within 1 hour:) 0 3 points  Count backwards from 20: 0 2 4 points  Name months of year: 0 2 4 points  Repeat Address: 0 2 4 6 8 10  points   Total Score: 0/28  Interpretation : Normal (0-7) Abnormal (8-28)    Assessment & Plan:     Annual Wellness Visit  Reviewed patient's Family Medical History Reviewed and updated list of patient's medical providers Assessment of cognitive impairment was done Assessed patient's functional ability Established a written schedule for health screening services Health Risk Assessent Completed and Reviewed 1. Medicare annual wellness visit, subsequent - TSH  2. Colon cancer screening - IFOBT POC (occult bld, rslt in office)  3. Other hyperlipidemia - Comprehensive metabolic panel - Lipid Panel With LDL/HDL Ratio Patient has recently gone from moderate intensity to high intensity statin treatment 4. Chronic kidney disease (CKD), stage III (moderate) - CBC w/Diff/Platelet - Comprehensive metabolic panel  5. Essential (primary) hypertension - CBC w/Diff/Platelet - Comprehensive  metabolic panel - TSH  6. History of cataract Refer back to [facility] ophthalmologist. Seen them before, for routine check up. - Ambulatory referral to Ophthalmology 7. Internal hemorrhoid May  need anoscopy 8. CAD All risk factors treated 9. BPH Follow this. Discussed referral to urology for prostate nodule. With present medical issues I agree with patient that it is fine to follow this. Yearly DRE. Explained to patient it is possible this is prostate cancer HPI, Exam and A&P transcribed under direction and in the presence of Julieanne Manson, MD I have done the exam and reviewed the chart and it is accurate to the best of my knowledge. Dentist has been used and  any errors in dictation or transcription are unintentional. Julieanne Manson M.D. [facility] .

## 2016-11-25 ENCOUNTER — Telehealth: Payer: Self-pay

## 2016-11-25 LAB — CBC WITH DIFFERENTIAL/PLATELET
Basophils Absolute: 0 10*3/uL (ref 0.0–0.2)
Basos: 0 %
EOS (ABSOLUTE): 0.3 10*3/uL (ref 0.0–0.4)
EOS: 4 %
HEMATOCRIT: 40.8 % (ref 37.5–51.0)
HEMOGLOBIN: 14 g/dL (ref 13.0–17.7)
Immature Grans (Abs): 0 10*3/uL (ref 0.0–0.1)
Immature Granulocytes: 0 %
LYMPHS ABS: 1.3 10*3/uL (ref 0.7–3.1)
Lymphs: 18 %
MCH: 31.2 pg (ref 26.6–33.0)
MCHC: 34.3 g/dL (ref 31.5–35.7)
MCV: 91 fL (ref 79–97)
MONOCYTES: 6 %
Monocytes Absolute: 0.5 10*3/uL (ref 0.1–0.9)
NEUTROS ABS: 5.2 10*3/uL (ref 1.4–7.0)
Neutrophils: 72 %
Platelets: 190 10*3/uL (ref 150–379)
RBC: 4.49 x10E6/uL (ref 4.14–5.80)
RDW: 13.9 % (ref 12.3–15.4)
WBC: 7.3 10*3/uL (ref 3.4–10.8)

## 2016-11-25 LAB — COMPREHENSIVE METABOLIC PANEL
A/G RATIO: 1.4 (ref 1.2–2.2)
ALBUMIN: 4 g/dL (ref 3.5–4.7)
ALK PHOS: 98 IU/L (ref 39–117)
ALT: 20 IU/L (ref 0–44)
AST: 24 IU/L (ref 0–40)
BILIRUBIN TOTAL: 0.3 mg/dL (ref 0.0–1.2)
BUN / CREAT RATIO: 19 (ref 10–24)
BUN: 35 mg/dL — ABNORMAL HIGH (ref 8–27)
CHLORIDE: 103 mmol/L (ref 96–106)
CO2: 24 mmol/L (ref 18–29)
CREATININE: 1.88 mg/dL — AB (ref 0.76–1.27)
Calcium: 9.3 mg/dL (ref 8.6–10.2)
GFR calc Af Amer: 38 mL/min/{1.73_m2} — ABNORMAL LOW (ref >59)
GFR calc non Af Amer: 33 mL/min/{1.73_m2} — ABNORMAL LOW (ref >59)
GLOBULIN, TOTAL: 2.9 g/dL (ref 1.5–4.5)
Glucose: 105 mg/dL — ABNORMAL HIGH (ref 65–99)
Potassium: 4.9 mmol/L (ref 3.5–5.2)
SODIUM: 141 mmol/L (ref 134–144)
Total Protein: 6.9 g/dL (ref 6.0–8.5)

## 2016-11-25 LAB — LIPID PANEL WITH LDL/HDL RATIO
CHOLESTEROL TOTAL: 179 mg/dL (ref 100–199)
HDL: 44 mg/dL (ref >39)
LDL CALC: 95 mg/dL (ref 0–99)
LDl/HDL Ratio: 2.2 ratio units (ref 0.0–3.6)
Triglycerides: 202 mg/dL — ABNORMAL HIGH (ref 0–149)
VLDL Cholesterol Cal: 40 mg/dL (ref 5–40)

## 2016-11-25 LAB — TSH: TSH: 3.15 u[IU]/mL (ref 0.450–4.500)

## 2016-11-25 NOTE — Telephone Encounter (Signed)
-----   Message from Maple Hudsonichard L Gilbert Jr., MD sent at 11/25/2016  7:52 AM EST ----- Labs stable

## 2016-11-25 NOTE — Telephone Encounter (Signed)
[id number] 

## 2016-11-26 NOTE — Telephone Encounter (Signed)
Pt advised-aa 

## 2017-01-17 DIAGNOSIS — Z961 Presence of intraocular lens: Secondary | ICD-10-CM | POA: Diagnosis not present

## 2017-01-17 DIAGNOSIS — H04123 Dry eye syndrome of bilateral lacrimal glands: Secondary | ICD-10-CM | POA: Diagnosis not present

## 2017-01-17 DIAGNOSIS — H18613 Keratoconus, stable, bilateral: Secondary | ICD-10-CM | POA: Diagnosis not present

## 2017-02-10 ENCOUNTER — Other Ambulatory Visit: Payer: Self-pay | Admitting: Family Medicine

## 2017-04-28 DIAGNOSIS — H18613 Keratoconus, stable, bilateral: Secondary | ICD-10-CM | POA: Diagnosis not present

## 2017-04-28 DIAGNOSIS — H52213 Irregular astigmatism, bilateral: Secondary | ICD-10-CM | POA: Diagnosis not present

## 2017-05-02 ENCOUNTER — Other Ambulatory Visit: Payer: Self-pay | Admitting: Family Medicine

## 2017-05-02 MED ORDER — ATORVASTATIN CALCIUM 40 MG PO TABS: 40.0000 mg | ORAL_TABLET | Freq: Every day | ORAL | 3 refills | Status: DC

## 2017-05-02 NOTE — Telephone Encounter (Signed)
[facility] pharmacy faxed a request on the following medication. Thanks CC   atorvastatin (LIPITOR) 40 MG tablet  Take 1 table by mouth daily.

## 2017-05-19 DIAGNOSIS — H18613 Keratoconus, stable, bilateral: Secondary | ICD-10-CM | POA: Diagnosis not present

## 2017-06-02 DIAGNOSIS — H18613 Keratoconus, stable, bilateral: Secondary | ICD-10-CM | POA: Diagnosis not present

## 2017-09-15 DIAGNOSIS — Z23 Encounter for immunization: Secondary | ICD-10-CM | POA: Diagnosis not present

## 2017-09-15 DIAGNOSIS — I714 Abdominal aortic aneurysm, without rupture: Secondary | ICD-10-CM | POA: Diagnosis not present

## 2017-09-15 DIAGNOSIS — I2510 Atherosclerotic heart disease of native coronary artery without angina pectoris: Secondary | ICD-10-CM | POA: Diagnosis not present

## 2017-09-15 DIAGNOSIS — I10 Essential (primary) hypertension: Secondary | ICD-10-CM | POA: Diagnosis not present

## 2017-09-22 DIAGNOSIS — R918 Other nonspecific abnormal finding of lung field: Secondary | ICD-10-CM | POA: Diagnosis not present

## 2017-09-22 DIAGNOSIS — T8132XA Disruption of internal operation (surgical) wound, not elsewhere classified, initial encounter: Secondary | ICD-10-CM | POA: Diagnosis not present

## 2017-09-22 DIAGNOSIS — Z951 Presence of aortocoronary bypass graft: Secondary | ICD-10-CM | POA: Diagnosis not present

## 2017-09-27 DIAGNOSIS — T8132XA Disruption of internal operation (surgical) wound, not elsewhere classified, initial encounter: Secondary | ICD-10-CM | POA: Insufficient documentation

## 2017-09-30 DIAGNOSIS — Z01818 Encounter for other preprocedural examination: Secondary | ICD-10-CM | POA: Diagnosis not present

## 2017-09-30 DIAGNOSIS — I2510 Atherosclerotic heart disease of native coronary artery without angina pectoris: Secondary | ICD-10-CM | POA: Diagnosis not present

## 2017-09-30 DIAGNOSIS — R768 Other specified abnormal immunological findings in serum: Secondary | ICD-10-CM | POA: Diagnosis not present

## 2017-10-03 DIAGNOSIS — I2510 Atherosclerotic heart disease of native coronary artery without angina pectoris: Secondary | ICD-10-CM | POA: Diagnosis not present

## 2017-10-03 DIAGNOSIS — T8189XA Other complications of procedures, not elsewhere classified, initial encounter: Secondary | ICD-10-CM | POA: Diagnosis not present

## 2017-10-03 DIAGNOSIS — R351 Nocturia: Secondary | ICD-10-CM | POA: Diagnosis not present

## 2017-10-03 DIAGNOSIS — N401 Enlarged prostate with lower urinary tract symptoms: Secondary | ICD-10-CM | POA: Diagnosis not present

## 2017-10-03 DIAGNOSIS — N183 Chronic kidney disease, stage 3 (moderate): Secondary | ICD-10-CM | POA: Diagnosis not present

## 2017-10-03 DIAGNOSIS — E1122 Type 2 diabetes mellitus with diabetic chronic kidney disease: Secondary | ICD-10-CM | POA: Diagnosis not present

## 2017-10-03 DIAGNOSIS — T8489XA Other specified complication of internal orthopedic prosthetic devices, implants and grafts, initial encounter: Secondary | ICD-10-CM | POA: Diagnosis not present

## 2017-10-03 DIAGNOSIS — M1712 Unilateral primary osteoarthritis, left knee: Secondary | ICD-10-CM | POA: Diagnosis not present

## 2017-10-03 DIAGNOSIS — I129 Hypertensive chronic kidney disease with stage 1 through stage 4 chronic kidney disease, or unspecified chronic kidney disease: Secondary | ICD-10-CM | POA: Diagnosis not present

## 2017-10-03 DIAGNOSIS — R768 Other specified abnormal immunological findings in serum: Secondary | ICD-10-CM | POA: Insufficient documentation

## 2017-10-03 DIAGNOSIS — Z951 Presence of aortocoronary bypass graft: Secondary | ICD-10-CM | POA: Diagnosis not present

## 2017-10-24 DIAGNOSIS — N184 Chronic kidney disease, stage 4 (severe): Secondary | ICD-10-CM | POA: Diagnosis not present

## 2017-11-28 ENCOUNTER — Encounter: Payer: Medicare Other | Admitting: Family Medicine

## 2017-11-28 DIAGNOSIS — H18613 Keratoconus, stable, bilateral: Secondary | ICD-10-CM | POA: Diagnosis not present

## 2017-11-28 DIAGNOSIS — H43812 Vitreous degeneration, left eye: Secondary | ICD-10-CM | POA: Diagnosis not present

## 2017-11-28 DIAGNOSIS — H52213 Irregular astigmatism, bilateral: Secondary | ICD-10-CM | POA: Diagnosis not present

## 2017-12-07 ENCOUNTER — Encounter: Payer: Self-pay | Admitting: Family Medicine

## 2017-12-07 ENCOUNTER — Ambulatory Visit (INDEPENDENT_AMBULATORY_CARE_PROVIDER_SITE_OTHER): Payer: Medicare Other | Admitting: Family Medicine

## 2017-12-07 VITALS — BP 122/62 | HR 56 | Temp 97.4°F | Resp 16 | Ht 70.0 in | Wt 166.0 lb

## 2017-12-07 DIAGNOSIS — E7849 Other hyperlipidemia: Secondary | ICD-10-CM

## 2017-12-07 DIAGNOSIS — I10 Essential (primary) hypertension: Secondary | ICD-10-CM

## 2017-12-07 DIAGNOSIS — Z0000 Encounter for general adult medical examination without abnormal findings: Secondary | ICD-10-CM

## 2017-12-07 LAB — CBC WITH DIFFERENTIAL/PLATELET
Basophils Absolute: 59 cells/uL (ref 0–200)
Basophils Relative: 0.9 %
EOS ABS: 338 {cells}/uL (ref 15–500)
Eosinophils Relative: 5.2 %
HCT: 38.4 % — ABNORMAL LOW (ref 38.5–50.0)
HEMOGLOBIN: 12.9 g/dL — AB (ref 13.2–17.1)
Lymphs Abs: 1268 cells/uL (ref 850–3900)
MCH: 30.6 pg (ref 27.0–33.0)
MCHC: 33.6 g/dL (ref 32.0–36.0)
MCV: 91 fL (ref 80.0–100.0)
MPV: 10 fL (ref 7.5–12.5)
Monocytes Relative: 6 %
NEUTROS ABS: 4446 {cells}/uL (ref 1500–7800)
Neutrophils Relative %: 68.4 %
Platelets: 221 10*3/uL (ref 140–400)
RBC: 4.22 10*6/uL (ref 4.20–5.80)
RDW: 12.2 % (ref 11.0–15.0)
Total Lymphocyte: 19.5 %
WBC: 6.5 10*3/uL (ref 3.8–10.8)
WBCMIX: 390 {cells}/uL (ref 200–950)

## 2017-12-07 LAB — LIPID PANEL
CHOL/HDL RATIO: 3.9 (calc) (ref <5.0)
Cholesterol: 175 mg/dL (ref <200)
HDL: 45 mg/dL (ref >40)
LDL CHOLESTEROL (CALC): 100 mg/dL — AB
Non-HDL Cholesterol (Calc): 130 mg/dL (calc) — ABNORMAL HIGH (ref <130)
Triglycerides: 178 mg/dL — ABNORMAL HIGH (ref <150)

## 2017-12-07 LAB — COMPLETE METABOLIC PANEL WITH GFR
AG Ratio: 1.4 (calc) (ref 1.0–2.5)
ALBUMIN MSPROF: 3.8 g/dL (ref 3.6–5.1)
ALKALINE PHOSPHATASE (APISO): 96 U/L (ref 40–115)
ALT: 12 U/L (ref 9–46)
AST: 15 U/L (ref 10–35)
BILIRUBIN TOTAL: 0.4 mg/dL (ref 0.2–1.2)
BUN / CREAT RATIO: 16 (calc) (ref 6–22)
BUN: 30 mg/dL — AB (ref 7–25)
CHLORIDE: 108 mmol/L (ref 98–110)
CO2: 28 mmol/L (ref 20–32)
Calcium: 9.1 mg/dL (ref 8.6–10.3)
Creat: 1.85 mg/dL — ABNORMAL HIGH (ref 0.70–1.11)
GFR, Est African American: 39 mL/min/{1.73_m2} — ABNORMAL LOW (ref >=60)
GFR, Est Non African American: 33 mL/min/{1.73_m2} — ABNORMAL LOW (ref >=60)
GLUCOSE: 105 mg/dL — AB (ref 65–99)
Globulin: 2.7 g/dL (calc) (ref 1.9–3.7)
Potassium: 5.1 mmol/L (ref 3.5–5.3)
Sodium: 141 mmol/L (ref 135–146)
Total Protein: 6.5 g/dL (ref 6.1–8.1)

## 2017-12-07 LAB — TSH: TSH: 1.73 m[IU]/L (ref 0.40–4.50)

## 2017-12-07 NOTE — Progress Notes (Signed)
Patient: Eric Mccall, Male    DOB: October 08, 1934, 83 y.o.   MRN: [id number] Visit Date: 12/07/2017  Today's Provider: Megan Mans, MD   Chief Complaint  Patient presents with  . Annual Exam   Subjective:    Annual physical exam Eric Mccall is a 83 y.o. male who presents today for health maintenance and complete physical. He feels well. He reports exercising some but not enough. He reports he is sleeping well.  -----------------------------------------------------------------  Colonoscopy- 04/26/05 diverticulosis.  (no pathology report)   Review of Systems  Constitutional: Positive for unexpected weight change (pt has lost 5-6 pounds in the last 10 months. ).  HENT: Positive for rhinorrhea.   Eyes: Negative.   Respiratory: Negative.   Cardiovascular: Negative.   Gastrointestinal: Negative.   Endocrine: Negative.   Genitourinary: Negative.   Musculoskeletal: Negative.   Skin: Negative.   Allergic/Immunologic: Negative.   Neurological: Negative.   Hematological: Negative.   Psychiatric/Behavioral: Negative.     Social History      He  reports that  has never smoked. he has never used smokeless tobacco. He reports that he drinks alcohol. He reports that he does not use drugs.       Social History   Socioeconomic History  . Marital status: Married    Spouse name: None  . Number of children: None  . Years of education: None  . Highest education level: None  Social Needs  . Financial resource strain: None  . Food insecurity - worry: None  . Food insecurity - inability: None  . Transportation needs - medical: None  . Transportation needs - non-medical: None  Occupational History  . None  Tobacco Use  . Smoking status: Never Smoker  . Smokeless tobacco: Never Used  Substance and Sexual Activity  . Alcohol use: Yes    Comment: monthly or less, 1 to 2 drinks at a time then  . Drug use: No  . Sexual activity: None  Other Topics Concern  .  None  Social History Narrative  . None    History reviewed. No pertinent past medical history.   Patient Active Problem List   Diagnosis Date Noted  . Abdominal aortic aneurysm (AAA) (HCC) 11/13/2015  . HLD (hyperlipidemia) 11/13/2015  . Heart & renal disease, hypertensive, with heart failure (HCC) 11/13/2015  . Irregular cardiac rhythm 11/13/2015  . Cardiac ischemia 11/13/2015  . Cataract, nuclear 11/13/2015  . Chronic kidney disease (CKD), stage III (moderate) (HCC) 07/07/2015  . Essential (primary) hypertension 07/07/2015  . Benign fibroma of prostate 09/09/2014  . NS (nuclear sclerosis) 09/06/2013  . Keratoconus of both eyes 09/06/2013  . Arteriosclerosis of coronary artery 06/27/2012    Past Surgical History:  Procedure Laterality Date  . CORONARY ARTERY BYPASS GRAFT      Family History        Family Status  Relation Name Status  . Mother  Deceased at age 17  . Father  Deceased at age 17  . Sister  Deceased at age 18  . Daughter  Alive  . Sister  Alive        His family history includes Breast cancer in his sister; Cancer in his mother; Depression in his mother; Heart attack in his father; Heart disease in his father.     No Known Allergies   Current Outpatient Medications:  .  amLODipine (NORVASC) 5 MG tablet, Take by mouth., Disp: , Rfl:  .  aspirin 81  MG tablet, Take by mouth., Disp: , Rfl:  .  atorvastatin (LIPITOR) 40 MG tablet, Take 1 tablet (40 mg total) by mouth daily., Disp: 90 tablet, Rfl: 3 .  clopidogrel (PLAVIX) 75 MG tablet, Take by mouth., Disp: , Rfl:  .  metoprolol succinate (TOPROL-XL) 25 MG 24 hr tablet, Take by mouth., Disp: , Rfl:  .  omeprazole (PRILOSEC) 20 MG capsule, TAKE 1 CAPSULE(20 MG) BY MOUTH DAILY, Disp: 90 capsule, Rfl: 4 .  ramipril (ALTACE) 2.5 MG capsule, Take by mouth., Disp: , Rfl:    Patient Care Team: Maple HudsonGilbert, Eugine Bubb L Jr., MD as PCP - General (Family Medicine)      Objective:   Vitals: BP 122/62 (BP Location:  Left Arm, Patient Position: Sitting, Cuff Size: Normal)   Pulse (!) 56   Temp (!) 97.4 F (36.3 C) (Oral)   Resp 16   Ht 5\' 10"  (1.778 m)   Wt 166 lb (75.3 kg)   BMI 23.82 kg/m    Vitals:   12/07/17 0957  BP: 122/62  Pulse: (!) 56  Resp: 16  Temp: (!) 97.4 F (36.3 C)  TempSrc: Oral  Weight: 166 lb (75.3 kg)  Height: 5\' 10"  (1.778 m)      Cognitive Testing - 6-CIT  Correct? Score   What year is it? yes 0 0 or 4  What month is it? yes 0 0 or 3  Memorize:    Floyde ParkinsJohn,  [place],  [address],  [place],      What time is it? (within 1 hour) yes 0 0 or 3  Count backwards from 20 yes 0 0, 2, or 4  Name the months of the year yes 0 0, 2, or 4  Repeat name & address above yes 0 0, 2, 4, 6, 8, or 10       TOTAL SCORE  0/28   Interpretation:  Normal  Normal (0-7) Abnormal (8-28)     Physical Exam  Constitutional: He is oriented to person, place, and time. He appears well-developed and well-nourished.  HENT:  Head: Normocephalic and atraumatic.  Right Ear: External ear normal.  Left Ear: External ear normal.  Nose: Nose normal.  Mouth/Throat: Oropharynx is clear and moist.  Eyes: Conjunctivae are normal. No scleral icterus.  Neck: No thyromegaly present.  Cardiovascular: Normal rate, regular rhythm, normal heart sounds and intact distal pulses.  Pulmonary/Chest: Effort normal and breath sounds normal.  Abdominal: Soft.  Musculoskeletal: Normal range of motion.  Lymphadenopathy:    He has no cervical adenopathy.  Neurological: He is alert and oriented to person, place, and time.  Skin: Skin is warm and dry.  Psychiatric: He has a normal mood and affect. His behavior is normal. Judgment and thought content normal.     Depression Screen PHQ 2/9 Scores 12/07/2017 11/24/2016 11/20/2015  PHQ - 2 Score 0 0 0  PHQ- 9 Score 0 - -      Assessment & Plan:     Routine Health Maintenance and Physical Exam  Exercise Activities and Dietary recommendations Goals    None       Immunization History  Administered Date(s) Administered  . Influenza, High Dose Seasonal PF 09/25/2015, 09/30/2016  . Pneumococcal Conjugate-13 11/05/2014  . Pneumococcal Polysaccharide-23 10/21/2004  . Td 05/23/2012  . Zoster 08/26/2011    Health Maintenance  Topic Date Due  . INFLUENZA VACCINE  07/13/2018 (Originally 07/13/2017)  . TETANUS/TDAP  05/23/2022  . PNA vac Low Risk Adult  Completed  Discussed health benefits of physical activity, and encouraged him to engage in regular exercise appropriate for his age and condition.  Weight Loss--Recheck in next 6 months as otherwise pt feels great.   --------------------------------------------------------------------   I have done the exam and reviewed the above chart and it is accurate to the best of my knowledge. DentistDragon  technology has been used in this note in any air is in the dictation or transcription are unintentional.  Megan Mansichard Exilda Wilhite Jr, MD  [facility]

## 2017-12-12 ENCOUNTER — Telehealth: Payer: Self-pay | Admitting: Family Medicine

## 2017-12-12 NOTE — Telephone Encounter (Signed)
Returning call for labs 

## 2017-12-12 NOTE — Progress Notes (Signed)
Advised  ED 

## 2017-12-12 NOTE — Telephone Encounter (Signed)
See lab result note-Brinnley Lacap V Dezmon Conover, RMA

## 2018-01-10 ENCOUNTER — Encounter: Payer: Self-pay | Admitting: Family Medicine

## 2018-01-23 DIAGNOSIS — H43812 Vitreous degeneration, left eye: Secondary | ICD-10-CM | POA: Diagnosis not present

## 2018-02-14 ENCOUNTER — Encounter: Payer: Self-pay | Admitting: Family Medicine

## 2018-02-14 ENCOUNTER — Ambulatory Visit: Payer: Medicare Other | Admitting: Family Medicine

## 2018-02-14 VITALS — BP 126/60 | HR 60 | Temp 97.7°F | Resp 16 | Wt 171.0 lb

## 2018-02-14 DIAGNOSIS — S0183XA Puncture wound without foreign body of other part of head, initial encounter: Secondary | ICD-10-CM | POA: Diagnosis not present

## 2018-02-14 DIAGNOSIS — S0511XA Contusion of eyeball and orbital tissues, right eye, initial encounter: Secondary | ICD-10-CM | POA: Diagnosis not present

## 2018-02-14 NOTE — Patient Instructions (Signed)
Stop aspirin until Friday. Ice area for 24 hours then use heat.

## 2018-02-14 NOTE — Progress Notes (Signed)
Patient: Eric Mccall Male    DOB: 1934/10/20   84 y.o.   MRN: [id number] Visit Date: 02/14/2018  Today's Provider: Megan Mansichard [name], MD   Chief Complaint  Patient presents with  . Black Eye   Subjective:    HPI Pt is here today because he is concerned about his eye. He reports that he was wrestling with his dog and his tooth hit right above his eye. He has a little wound but his main concern is the bruising around the eye. He reports that the incident happened yesterday and the bruising seems to be still moving down his face more. He is on Plavix and ASA 81 mg daily. He wants to know if he should stop this for a couple of days.  Pt is scheduled to leave for vacation tomorrow to travel around the world for the next 32 days. He wants to make sure he is ok to do so.  His last Td was 05/23/2012.      No Known Allergies   Current Outpatient Medications:  .  amLODipine (NORVASC) 5 MG tablet, Take by mouth., Disp: , Rfl:  .  aspirin 81 MG tablet, Take by mouth., Disp: , Rfl:  .  atorvastatin (LIPITOR) 40 MG tablet, Take 1 tablet (40 mg total) by mouth daily., Disp: 90 tablet, Rfl: 3 .  clopidogrel (PLAVIX) 75 MG tablet, Take by mouth., Disp: , Rfl:  .  metoprolol succinate (TOPROL-XL) 25 MG 24 hr tablet, Take by mouth., Disp: , Rfl:  .  omeprazole (PRILOSEC) 20 MG capsule, TAKE 1 CAPSULE(20 MG) BY MOUTH DAILY, Disp: 90 capsule, Rfl: 4 .  ramipril (ALTACE) 2.5 MG capsule, Take by mouth., Disp: , Rfl:   Review of Systems  Constitutional: Negative.   HENT: Negative.   Eyes: Negative.   Respiratory: Negative.   Cardiovascular: Negative.   Gastrointestinal: Negative.   Endocrine: Negative.   Genitourinary: Negative.   Musculoskeletal: Negative.   Skin: Positive for color change and wound.  Allergic/Immunologic: Negative.   Neurological: Negative.   Hematological: Bruises/bleeds easily.  Psychiatric/Behavioral: Negative.     Social History   Tobacco Use  . Smoking  status: Never Smoker  . Smokeless tobacco: Never Used  Substance Use Topics  . Alcohol use: Yes    Comment: monthly or less, 1 to 2 drinks at a time then   Objective:   BP 126/60 (BP Location: Left Arm, Patient Position: Sitting, Cuff Size: Normal)   Pulse 60   Temp 97.7 F (36.5 C) (Oral)   Resp 16   Wt 171 lb (77.6 kg)   BMI 24.54 kg/m  Vitals:   02/14/18 1449  BP: 126/60  Pulse: 60  Resp: 16  Temp: 97.7 F (36.5 C)  TempSrc: Oral  Weight: 171 lb (77.6 kg)     Physical Exam  Constitutional: He appears well-developed and well-nourished.  HENT:  Head: Normocephalic.  Right Ear: External ear normal.  Left Ear: External ear normal.  Nose: Nose normal.  Mouth/Throat: Oropharynx is clear and moist.  Eyes: Conjunctivae and EOM are normal. Pupils are equal, round, and reactive to light. No scleral icterus.  Ecchymosis around right eye with small superior puncture wound. Nontender under/around eyes. FROM. Vision good. Visual fields ok.  Cardiovascular: Normal rate, regular rhythm and normal heart sounds.        Assessment & Plan:     Eye contusion Small puncture wound So small no antibiotics needed.No infection . Use ice  today then warm compresses starting tomorrow. Pt leaving on month long vacation tomorrow.     I have done the exam and reviewed the above chart and it is accurate to the best of my knowledge. Dentist has been used in this note in any air is in the dictation or transcription are unintentional.  Megan Mans, MD  [facility]

## 2018-03-27 DIAGNOSIS — H52213 Irregular astigmatism, bilateral: Secondary | ICD-10-CM | POA: Diagnosis not present

## 2018-03-27 DIAGNOSIS — H18613 Keratoconus, stable, bilateral: Secondary | ICD-10-CM | POA: Diagnosis not present

## 2018-04-03 DIAGNOSIS — H18613 Keratoconus, stable, bilateral: Secondary | ICD-10-CM | POA: Diagnosis not present

## 2018-04-28 DIAGNOSIS — R001 Bradycardia, unspecified: Secondary | ICD-10-CM | POA: Diagnosis not present

## 2018-04-28 DIAGNOSIS — I2510 Atherosclerotic heart disease of native coronary artery without angina pectoris: Secondary | ICD-10-CM | POA: Diagnosis not present

## 2018-05-23 DIAGNOSIS — I2510 Atherosclerotic heart disease of native coronary artery without angina pectoris: Secondary | ICD-10-CM | POA: Diagnosis not present

## 2018-06-04 ENCOUNTER — Other Ambulatory Visit: Payer: Self-pay | Admitting: Family Medicine

## 2018-06-06 ENCOUNTER — Other Ambulatory Visit: Payer: Self-pay | Admitting: Family Medicine

## 2018-06-07 ENCOUNTER — Ambulatory Visit: Payer: Self-pay | Admitting: Family Medicine

## 2018-08-04 DIAGNOSIS — I2510 Atherosclerotic heart disease of native coronary artery without angina pectoris: Secondary | ICD-10-CM | POA: Diagnosis not present

## 2018-08-04 DIAGNOSIS — R001 Bradycardia, unspecified: Secondary | ICD-10-CM | POA: Diagnosis not present

## 2018-09-06 ENCOUNTER — Encounter: Payer: Self-pay | Admitting: Family Medicine

## 2018-09-06 ENCOUNTER — Ambulatory Visit: Admission: RE | Admit: 2018-09-06 | Payer: Medicare Other | Source: Ambulatory Visit | Admitting: Family Medicine

## 2018-09-06 ENCOUNTER — Ambulatory Visit
Admission: RE | Admit: 2018-09-06 | Discharge: 2018-09-06 | Disposition: A | Discharge status: Home / Self Care | Payer: Medicare Other | Source: Ambulatory Visit | Attending: Family Medicine | Admitting: Family Medicine

## 2018-09-06 ENCOUNTER — Ambulatory Visit: Payer: Medicare Other | Admitting: Family Medicine

## 2018-09-06 VITALS — BP 128/68 | HR 58 | Temp 98.5°F | Resp 16 | Wt 163.0 lb

## 2018-09-06 DIAGNOSIS — M25562 Pain in left knee: Secondary | ICD-10-CM | POA: Diagnosis not present

## 2018-09-06 DIAGNOSIS — Z23 Encounter for immunization: Secondary | ICD-10-CM

## 2018-09-06 DIAGNOSIS — G8929 Other chronic pain: Secondary | ICD-10-CM

## 2018-09-06 DIAGNOSIS — R937 Abnormal findings on diagnostic imaging of other parts of musculoskeletal system: Secondary | ICD-10-CM | POA: Diagnosis not present

## 2018-09-06 DIAGNOSIS — M1712 Unilateral primary osteoarthritis, left knee: Secondary | ICD-10-CM | POA: Diagnosis not present

## 2018-09-06 MED ORDER — PREDNISONE 10 MG (21) PO TBPK: ORAL_TABLET | ORAL | 0 refills | Status: DC

## 2018-09-06 NOTE — Progress Notes (Signed)
Patient: Eric Mccall Male    DOB: 1934/10/30   84 y.o.   MRN: [id number] Visit Date: 09/06/2018  Today's Provider: Megan Mans, MD   Chief Complaint  Patient presents with  . Knee Pain   Subjective:    Knee Pain   There was no injury mechanism. The pain is present in the left knee. The pain is at a severity of 4/10. The pain is moderate (can be severe). The pain has been constant since onset. Pertinent negatives include no inability to bear weight, loss of motion, muscle weakness, numbness or tingling. The symptoms are aggravated by movement. He has tried acetaminophen for the symptoms. The treatment provided mild relief.       No Known Allergies   Current Outpatient Medications:  .  amLODipine (NORVASC) 5 MG tablet, Take by mouth., Disp: , Rfl:  .  aspirin 81 MG tablet, Take by mouth., Disp: , Rfl:  .  atorvastatin (LIPITOR) 40 MG tablet, TAKE 1 TABLET(40 MG) BY MOUTH DAILY, Disp: 90 tablet, Rfl: 3 .  clopidogrel (PLAVIX) 75 MG tablet, Take by mouth., Disp: , Rfl:  .  metoprolol succinate (TOPROL-XL) 25 MG 24 hr tablet, Take by mouth., Disp: , Rfl:  .  omeprazole (PRILOSEC) 20 MG capsule, TAKE ONE CAPSULE BY MOUTH DAILY, Disp: 90 capsule, Rfl: 3 .  ramipril (ALTACE) 2.5 MG capsule, Take by mouth., Disp: , Rfl:   Review of Systems  Constitutional: Negative for activity change, appetite change, chills, diaphoresis, fatigue and unexpected weight change.  Eyes: Negative.   Respiratory: Negative.   Cardiovascular: Negative.   Musculoskeletal: Positive for arthralgias and myalgias.  Allergic/Immunologic: Negative.   Neurological: Negative for tingling and numbness.  Psychiatric/Behavioral: Negative.     Social History   Tobacco Use  . Smoking status: Never Smoker  . Smokeless tobacco: Never Used  Substance Use Topics  . Alcohol use: Yes    Comment: monthly or less, 1 to 2 drinks at a time then   Objective:   BP 128/68 (BP Location: Right Arm, Patient  Position: Sitting, Cuff Size: Normal)   Pulse (!) 58   Temp 98.5 F (36.9 C)   Resp 16   Wt 163 lb (73.9 kg)   SpO2 98%   BMI 23.39 kg/m  Vitals:   09/06/18 1329  BP: 128/68  Pulse: (!) 58  Resp: 16  Temp: 98.5 F (36.9 C)  SpO2: 98%  Weight: 163 lb (73.9 kg)     Physical Exam  Constitutional: He is oriented to person, place, and time. He appears well-developed and well-nourished.  HENT:  Head: Normocephalic and atraumatic.  Right Ear: External ear normal.  Left Ear: External ear normal.  Nose: Nose normal.  Eyes: Conjunctivae are normal. No scleral icterus.  Neck: No thyromegaly present.  Cardiovascular: Normal rate, regular rhythm and normal heart sounds.  Pulmonary/Chest: Effort normal and breath sounds normal.  Abdominal: Soft.  Musculoskeletal: He exhibits no edema.  Medial joint line pain/tenderness with stable knee.  Neurological: He is alert and oriented to person, place, and time.  Skin: Skin is warm and dry.  Psychiatric: He has a normal mood and affect. His behavior is normal. Judgment and thought content normal.        Assessment & Plan:     1. Chronic pain of left knee Meniscus vs MCL Suspect meniscus tear. Treat as below. Refer to [facility] orth[facility] per patient preference.  - predniSONE (STERAPRED UNI-PAK 21 TAB) 10  MG (21) TBPK tablet; Taper as directed.  Dispense: 21 tablet; Refill: 0 - Ambulatory referral to Orthopedic Surgery - DG Knee Complete 4 Views Left; Future  2. Flu vaccine need - Flu vaccine HIGH DOSE PF (Fluzone High dose)       I have done the exam and reviewed the above chart and it is accurate to the best of my knowledge. Dentist has been used in this note in any air is in the dictation or transcription are unintentional.  Megan Mans, MD  [facility]

## 2018-09-11 DIAGNOSIS — M1712 Unilateral primary osteoarthritis, left knee: Secondary | ICD-10-CM | POA: Diagnosis not present

## 2018-09-11 DIAGNOSIS — M25562 Pain in left knee: Secondary | ICD-10-CM | POA: Diagnosis not present

## 2018-09-11 DIAGNOSIS — G8929 Other chronic pain: Secondary | ICD-10-CM | POA: Diagnosis not present

## 2018-10-30 DIAGNOSIS — H18613 Keratoconus, stable, bilateral: Secondary | ICD-10-CM | POA: Diagnosis not present

## 2018-10-30 DIAGNOSIS — H52213 Irregular astigmatism, bilateral: Secondary | ICD-10-CM | POA: Diagnosis not present

## 2018-11-13 DIAGNOSIS — N189 Chronic kidney disease, unspecified: Secondary | ICD-10-CM | POA: Diagnosis not present

## 2018-11-13 DIAGNOSIS — N2581 Secondary hyperparathyroidism of renal origin: Secondary | ICD-10-CM | POA: Diagnosis not present

## 2018-11-13 DIAGNOSIS — I10 Essential (primary) hypertension: Secondary | ICD-10-CM | POA: Diagnosis not present

## 2018-12-18 DIAGNOSIS — G8929 Other chronic pain: Secondary | ICD-10-CM | POA: Diagnosis not present

## 2018-12-18 DIAGNOSIS — M1712 Unilateral primary osteoarthritis, left knee: Secondary | ICD-10-CM | POA: Diagnosis not present

## 2018-12-18 DIAGNOSIS — M25562 Pain in left knee: Secondary | ICD-10-CM | POA: Diagnosis not present

## 2019-02-08 DIAGNOSIS — M1712 Unilateral primary osteoarthritis, left knee: Secondary | ICD-10-CM | POA: Diagnosis not present

## 2019-02-08 DIAGNOSIS — R2242 Localized swelling, mass and lump, left lower limb: Secondary | ICD-10-CM | POA: Diagnosis not present

## 2019-02-09 DIAGNOSIS — R2242 Localized swelling, mass and lump, left lower limb: Secondary | ICD-10-CM | POA: Diagnosis not present

## 2019-02-09 DIAGNOSIS — I724 Aneurysm of artery of lower extremity: Secondary | ICD-10-CM | POA: Diagnosis not present

## 2019-02-09 DIAGNOSIS — M1712 Unilateral primary osteoarthritis, left knee: Secondary | ICD-10-CM | POA: Diagnosis not present

## 2019-03-02 DIAGNOSIS — I723 Aneurysm of iliac artery: Secondary | ICD-10-CM | POA: Diagnosis not present

## 2019-03-02 DIAGNOSIS — I724 Aneurysm of artery of lower extremity: Secondary | ICD-10-CM | POA: Diagnosis not present

## 2019-03-02 DIAGNOSIS — I714 Abdominal aortic aneurysm, without rupture: Secondary | ICD-10-CM | POA: Diagnosis not present

## 2019-03-02 DIAGNOSIS — Z01818 Encounter for other preprocedural examination: Secondary | ICD-10-CM | POA: Diagnosis not present

## 2019-03-02 DIAGNOSIS — I513 Intracardiac thrombosis, not elsewhere classified: Secondary | ICD-10-CM | POA: Diagnosis not present

## 2019-03-05 DIAGNOSIS — I739 Peripheral vascular disease, unspecified: Secondary | ICD-10-CM | POA: Diagnosis not present

## 2019-03-05 DIAGNOSIS — I724 Aneurysm of artery of lower extremity: Secondary | ICD-10-CM | POA: Diagnosis not present

## 2019-03-05 DIAGNOSIS — Z01818 Encounter for other preprocedural examination: Secondary | ICD-10-CM | POA: Diagnosis not present

## 2019-03-16 DIAGNOSIS — Z1159 Encounter for screening for other viral diseases: Secondary | ICD-10-CM | POA: Diagnosis not present

## 2019-03-16 DIAGNOSIS — Z01812 Encounter for preprocedural laboratory examination: Secondary | ICD-10-CM | POA: Diagnosis not present

## 2019-03-21 DIAGNOSIS — I129 Hypertensive chronic kidney disease with stage 1 through stage 4 chronic kidney disease, or unspecified chronic kidney disease: Secondary | ICD-10-CM | POA: Diagnosis not present

## 2019-03-21 DIAGNOSIS — Z7982 Long term (current) use of aspirin: Secondary | ICD-10-CM | POA: Diagnosis not present

## 2019-03-21 DIAGNOSIS — Z79899 Other long term (current) drug therapy: Secondary | ICD-10-CM | POA: Diagnosis not present

## 2019-03-21 DIAGNOSIS — Z9889 Other specified postprocedural states: Secondary | ICD-10-CM | POA: Diagnosis not present

## 2019-03-21 DIAGNOSIS — N400 Enlarged prostate without lower urinary tract symptoms: Secondary | ICD-10-CM | POA: Diagnosis not present

## 2019-03-21 DIAGNOSIS — I2510 Atherosclerotic heart disease of native coronary artery without angina pectoris: Secondary | ICD-10-CM | POA: Diagnosis not present

## 2019-03-21 DIAGNOSIS — Z7902 Long term (current) use of antithrombotics/antiplatelets: Secondary | ICD-10-CM | POA: Diagnosis not present

## 2019-03-21 DIAGNOSIS — I724 Aneurysm of artery of lower extremity: Secondary | ICD-10-CM | POA: Diagnosis not present

## 2019-03-21 DIAGNOSIS — Z961 Presence of intraocular lens: Secondary | ICD-10-CM | POA: Diagnosis not present

## 2019-03-21 DIAGNOSIS — E785 Hyperlipidemia, unspecified: Secondary | ICD-10-CM | POA: Diagnosis not present

## 2019-03-21 DIAGNOSIS — R768 Other specified abnormal immunological findings in serum: Secondary | ICD-10-CM | POA: Diagnosis not present

## 2019-03-21 DIAGNOSIS — Z951 Presence of aortocoronary bypass graft: Secondary | ICD-10-CM | POA: Diagnosis not present

## 2019-03-21 DIAGNOSIS — Z9841 Cataract extraction status, right eye: Secondary | ICD-10-CM | POA: Diagnosis not present

## 2019-03-21 DIAGNOSIS — Z9842 Cataract extraction status, left eye: Secondary | ICD-10-CM | POA: Diagnosis not present

## 2019-03-21 DIAGNOSIS — N183 Chronic kidney disease, stage 3 (moderate): Secondary | ICD-10-CM | POA: Diagnosis not present

## 2019-03-21 DIAGNOSIS — I252 Old myocardial infarction: Secondary | ICD-10-CM | POA: Diagnosis not present

## 2019-03-22 DIAGNOSIS — I724 Aneurysm of artery of lower extremity: Secondary | ICD-10-CM | POA: Insufficient documentation

## 2019-03-25 DIAGNOSIS — I724 Aneurysm of artery of lower extremity: Secondary | ICD-10-CM | POA: Diagnosis not present

## 2019-05-21 DIAGNOSIS — N189 Chronic kidney disease, unspecified: Secondary | ICD-10-CM | POA: Diagnosis not present

## 2019-05-21 DIAGNOSIS — N2581 Secondary hyperparathyroidism of renal origin: Secondary | ICD-10-CM | POA: Diagnosis not present

## 2019-05-23 DIAGNOSIS — N400 Enlarged prostate without lower urinary tract symptoms: Secondary | ICD-10-CM | POA: Diagnosis not present

## 2019-05-23 DIAGNOSIS — I714 Abdominal aortic aneurysm, without rupture: Secondary | ICD-10-CM | POA: Diagnosis not present

## 2019-05-23 DIAGNOSIS — I2510 Atherosclerotic heart disease of native coronary artery without angina pectoris: Secondary | ICD-10-CM | POA: Diagnosis not present

## 2019-05-23 DIAGNOSIS — I724 Aneurysm of artery of lower extremity: Secondary | ICD-10-CM | POA: Diagnosis not present

## 2019-05-30 DIAGNOSIS — E785 Hyperlipidemia, unspecified: Secondary | ICD-10-CM | POA: Diagnosis not present

## 2019-05-30 DIAGNOSIS — I724 Aneurysm of artery of lower extremity: Secondary | ICD-10-CM | POA: Diagnosis not present

## 2019-05-30 DIAGNOSIS — N183 Chronic kidney disease, stage 3 (moderate): Secondary | ICD-10-CM | POA: Diagnosis not present

## 2019-05-30 DIAGNOSIS — G629 Polyneuropathy, unspecified: Secondary | ICD-10-CM | POA: Diagnosis not present

## 2019-05-30 DIAGNOSIS — I739 Peripheral vascular disease, unspecified: Secondary | ICD-10-CM | POA: Diagnosis not present

## 2019-05-30 DIAGNOSIS — M1712 Unilateral primary osteoarthritis, left knee: Secondary | ICD-10-CM | POA: Diagnosis not present

## 2019-05-30 DIAGNOSIS — D62 Acute posthemorrhagic anemia: Secondary | ICD-10-CM | POA: Diagnosis not present

## 2019-05-30 DIAGNOSIS — D631 Anemia in chronic kidney disease: Secondary | ICD-10-CM | POA: Diagnosis not present

## 2019-05-30 DIAGNOSIS — I252 Old myocardial infarction: Secondary | ICD-10-CM | POA: Diagnosis not present

## 2019-05-30 DIAGNOSIS — I129 Hypertensive chronic kidney disease with stage 1 through stage 4 chronic kidney disease, or unspecified chronic kidney disease: Secondary | ICD-10-CM | POA: Diagnosis not present

## 2019-05-30 DIAGNOSIS — Z7982 Long term (current) use of aspirin: Secondary | ICD-10-CM | POA: Diagnosis not present

## 2019-05-30 DIAGNOSIS — Z961 Presence of intraocular lens: Secondary | ICD-10-CM | POA: Diagnosis not present

## 2019-05-30 DIAGNOSIS — K219 Gastro-esophageal reflux disease without esophagitis: Secondary | ICD-10-CM | POA: Diagnosis not present

## 2019-05-30 DIAGNOSIS — N400 Enlarged prostate without lower urinary tract symptoms: Secondary | ICD-10-CM | POA: Diagnosis not present

## 2019-05-30 DIAGNOSIS — Z1159 Encounter for screening for other viral diseases: Secondary | ICD-10-CM | POA: Diagnosis not present

## 2019-05-30 DIAGNOSIS — Z9889 Other specified postprocedural states: Secondary | ICD-10-CM | POA: Diagnosis not present

## 2019-05-30 DIAGNOSIS — I2510 Atherosclerotic heart disease of native coronary artery without angina pectoris: Secondary | ICD-10-CM | POA: Diagnosis not present

## 2019-06-13 DIAGNOSIS — I129 Hypertensive chronic kidney disease with stage 1 through stage 4 chronic kidney disease, or unspecified chronic kidney disease: Secondary | ICD-10-CM | POA: Diagnosis not present

## 2019-06-13 DIAGNOSIS — E875 Hyperkalemia: Secondary | ICD-10-CM | POA: Diagnosis not present

## 2019-06-13 DIAGNOSIS — N179 Acute kidney failure, unspecified: Secondary | ICD-10-CM | POA: Diagnosis not present

## 2019-06-13 DIAGNOSIS — I10 Essential (primary) hypertension: Secondary | ICD-10-CM | POA: Diagnosis not present

## 2019-06-13 DIAGNOSIS — N183 Chronic kidney disease, stage 3 (moderate): Secondary | ICD-10-CM | POA: Diagnosis not present

## 2019-06-21 DIAGNOSIS — E875 Hyperkalemia: Secondary | ICD-10-CM | POA: Diagnosis not present

## 2019-06-22 ENCOUNTER — Other Ambulatory Visit: Payer: Self-pay | Admitting: Family Medicine

## 2019-07-13 DIAGNOSIS — I739 Peripheral vascular disease, unspecified: Secondary | ICD-10-CM | POA: Diagnosis not present

## 2019-07-18 DIAGNOSIS — Z79899 Other long term (current) drug therapy: Secondary | ICD-10-CM | POA: Diagnosis not present

## 2019-07-18 DIAGNOSIS — E785 Hyperlipidemia, unspecified: Secondary | ICD-10-CM | POA: Diagnosis not present

## 2019-07-18 DIAGNOSIS — I129 Hypertensive chronic kidney disease with stage 1 through stage 4 chronic kidney disease, or unspecified chronic kidney disease: Secondary | ICD-10-CM | POA: Diagnosis not present

## 2019-07-18 DIAGNOSIS — N184 Chronic kidney disease, stage 4 (severe): Secondary | ICD-10-CM | POA: Diagnosis not present

## 2019-07-18 DIAGNOSIS — N183 Chronic kidney disease, stage 3 (moderate): Secondary | ICD-10-CM | POA: Diagnosis not present

## 2019-07-18 DIAGNOSIS — R600 Localized edema: Secondary | ICD-10-CM | POA: Diagnosis not present

## 2019-07-18 DIAGNOSIS — D631 Anemia in chronic kidney disease: Secondary | ICD-10-CM | POA: Diagnosis not present

## 2019-07-30 DIAGNOSIS — H18603 Keratoconus, unspecified, bilateral: Secondary | ICD-10-CM | POA: Diagnosis not present

## 2019-07-30 DIAGNOSIS — I2510 Atherosclerotic heart disease of native coronary artery without angina pectoris: Secondary | ICD-10-CM | POA: Diagnosis not present

## 2019-08-14 ENCOUNTER — Encounter: Payer: Self-pay | Admitting: Family Medicine

## 2019-08-22 ENCOUNTER — Ambulatory Visit (INDEPENDENT_AMBULATORY_CARE_PROVIDER_SITE_OTHER): Payer: Medicare Other

## 2019-08-22 ENCOUNTER — Other Ambulatory Visit: Payer: Self-pay

## 2019-08-22 DIAGNOSIS — Z23 Encounter for immunization: Secondary | ICD-10-CM

## 2019-09-18 DIAGNOSIS — N189 Chronic kidney disease, unspecified: Secondary | ICD-10-CM | POA: Diagnosis not present

## 2019-09-18 DIAGNOSIS — N2581 Secondary hyperparathyroidism of renal origin: Secondary | ICD-10-CM | POA: Diagnosis not present

## 2019-09-21 DIAGNOSIS — N184 Chronic kidney disease, stage 4 (severe): Secondary | ICD-10-CM | POA: Diagnosis not present

## 2019-10-30 NOTE — Progress Notes (Signed)
Patient: Eric Mccall, Male    DOB: 25-Jan-1934, 85 y.o.   MRN: [id number] Visit Date: 10/31/2019  Today's Provider: Wilhemena Durie, MD   Chief Complaint  Patient presents with  . Annual Exam   Subjective:     Annual wellness visit/annual physical Eric Mccall is a 85 y.o. male. He feels fairly well. He reports exercising doing yardwork. He reports he is sleeping fairly well, but wakes up in the middle of the night and it takes a while to fall back asleep.  -----------------------------------------------------------  Colonoscopy: 04/26/2005  Review of Systems  Constitutional: Negative.   HENT: Negative.   Eyes: Negative.   Respiratory: Negative.   Cardiovascular: Negative.   Gastrointestinal: Negative.   Endocrine: Negative.   Musculoskeletal: Positive for myalgias.  Allergic/Immunologic: Negative.   Neurological: Negative.   Psychiatric/Behavioral: Negative.     Social History   Socioeconomic History  . Marital status: Married    Spouse name: Not on file  . Number of children: Not on file  . Years of education: Not on file  . Highest education level: Not on file  Occupational History  . Not on file  Social Needs  . Financial resource strain: Not on file  . Food insecurity    Worry: Not on file    Inability: Not on file  . Transportation needs    Medical: Not on file    Non-medical: Not on file  Tobacco Use  . Smoking status: Never Smoker  . Smokeless tobacco: Never Used  Substance and Sexual Activity  . Alcohol use: Yes    Comment: monthly or less, 1 to 2 drinks at a time then  . Drug use: No  . Sexual activity: Not on file  Lifestyle  . Physical activity    Days per week: Not on file    Minutes per session: Not on file  . Stress: Not on file  Relationships  . Social Herbalist on phone: Not on file    Gets together: Not on file    Attends religious service: Not on file    Active member of club or organization: Not on  file    Attends meetings of clubs or organizations: Not on file    Relationship status: Not on file  . Intimate partner violence    Fear of current or ex partner: Not on file    Emotionally abused: Not on file    Physically abused: Not on file    Forced sexual activity: Not on file  Other Topics Concern  . Not on file  Social History Narrative  . Not on file    No past medical history on file.   Patient Active Problem List   Diagnosis Date Noted  . Popliteal artery aneurysm, bilateral ([place]) 03/22/2019  . Red blood cell antibody positive 10/03/2017  . Sternal wound dehiscence 09/27/2017  . Abdominal aortic aneurysm (AAA) ([place]) 11/13/2015  . HLD (hyperlipidemia) 11/13/2015  . Heart & renal disease, hypertensive, with heart failure ([place]) 11/13/2015  . Irregular cardiac rhythm 11/13/2015  . Cardiac ischemia 11/13/2015  . Cataract, nuclear 11/13/2015  . Chronic kidney disease (CKD), stage III (moderate) 07/07/2015  . Essential (primary) hypertension 07/07/2015  . Benign fibroma of prostate 09/09/2014  . NS (nuclear sclerosis) 09/06/2013  . Keratoconus of both eyes 09/06/2013  . Prostate nodule 09/06/2013  . Arteriosclerosis of coronary artery 06/27/2012    Past Surgical History:  Procedure Laterality Date  .  CORONARY ARTERY BYPASS GRAFT      His family history includes Breast cancer in his sister; Cancer in his mother; Depression in his mother; Heart attack in his father; Heart disease in his father.   Current Outpatient Medications:  .  amLODipine (NORVASC) 5 MG tablet, Take by mouth., Disp: , Rfl:  .  aspirin 81 MG tablet, Take by mouth., Disp: , Rfl:  .  atorvastatin (LIPITOR) 40 MG tablet, TAKE 1 TABLET(40 MG) BY MOUTH DAILY, Disp: 90 tablet, Rfl: 3 .  clopidogrel (PLAVIX) 75 MG tablet, Take by mouth., Disp: , Rfl:  .  metoprolol succinate (TOPROL-XL) 25 MG 24 hr tablet, Take by mouth., Disp: , Rfl:  .  omeprazole (PRILOSEC) 20 MG capsule, TAKE ONE CAPSULE BY MOUTH  DAILY, Disp: 90 capsule, Rfl: 3 .  ramipril (ALTACE) 2.5 MG capsule, Take by mouth., Disp: , Rfl:  .  sodium bicarbonate 650 MG tablet, Take 650 mg by mouth 2 (two) times daily., Disp: , Rfl:  .  Vitamin D, Cholecalciferol, 50 MCG (2000 UT) CAPS, Take by mouth., Disp: , Rfl:   Patient Care Team: Jerrol Banana., MD as PCP - General (Family Medicine)    Objective:    Vitals: BP 122/68   Pulse 61   Temp (!) 97.3 F (36.3 C) (Temporal)   Resp 16   Ht '5\' 9"'  (1.753 m)   Wt 165 lb (74.8 kg)   BMI 24.37 kg/m   Physical Exam Vitals signs reviewed.  Constitutional:      Appearance: He is well-developed.  HENT:     Head: Normocephalic and atraumatic.     Right Ear: External ear normal.     Left Ear: External ear normal.     Nose: Nose normal.  Eyes:     General: No scleral icterus.    Conjunctiva/sclera: Conjunctivae normal.  Neck:     Thyroid: No thyromegaly.  Cardiovascular:     Rate and Rhythm: Normal rate and regular rhythm.     Heart sounds: Normal heart sounds.  Pulmonary:     Effort: Pulmonary effort is normal.     Breath sounds: Normal breath sounds.  Abdominal:     Palpations: Abdomen is soft.  Musculoskeletal: Normal range of motion.  Lymphadenopathy:     Cervical: No cervical adenopathy.  Skin:    General: Skin is warm and dry.  Neurological:     General: No focal deficit present.     Mental Status: He is alert and oriented to person, place, and time.  Psychiatric:        Mood and Affect: Mood normal.        Behavior: Behavior normal.        Thought Content: Thought content normal.        Judgment: Judgment normal.     Activities of Daily Living In your present state of health, do you have any difficulty performing the following activities: 10/31/2019  Hearing? Y  Vision? N  Difficulty concentrating or making decisions? N  Walking or climbing stairs? N  Dressing or bathing? N  Doing errands, shopping? N  Some recent data might be hidden     Fall Risk Assessment Fall Risk  10/31/2019 12/07/2017 11/24/2016 11/20/2015  Falls in the past year? 0 No No No  Number falls in past yr: 0 - - -  Injury with Fall? 0 - - -  Follow up Falls evaluation completed - - -     Depression Screen [facility] 2/9  Scores 10/31/2019 12/07/2017 11/24/2016 11/20/2015  PHQ - 2 Score 0 0 0 0  PHQ- 9 Score 2 0 - -    6CIT Screen 11/24/2016  What Year? 0 points  What month? 0 points  What time? 0 points  Count back from 20 0 points  Months in reverse 0 points  Repeat phrase 0 points  Total Score 0      Assessment & Plan:     Annual Wellness Visit  Reviewed patient's Family Medical History Reviewed and updated list of patient's medical providers Assessment of cognitive impairment was done Assessed patient's functional ability Established a written schedule for health screening [place] Completed and Reviewed  Exercise Activities and Dietary recommendations Goals   None     Immunization History  Administered Date(s) Administered  . Fluad Quad(high Dose 65+) 08/22/2019  . Influenza, High Dose Seasonal PF 09/25/2015, 09/30/2016, 09/06/2018  . Pneumococcal Conjugate-13 11/05/2014  . Pneumococcal Polysaccharide-23 10/21/2004  . Td 05/23/2012  . Zoster 08/26/2011    Health Maintenance  Topic Date Due  . Samul Dada  05/23/2022  . INFLUENZA VACCINE  Completed  . PNA vac Low Risk Adult  Completed     Discussed health benefits of physical activity, and encouraged him to engage in regular exercise appropriate for his age and condition.    ------------------------------------------------------------------------------------------------------------  .1. Essential (primary) hypertension  - POCT Urinalysis Dipstick - Lipid Profile - Comp Met (CMET) - CBC with Diff - TSH  2. Heart & renal disease, hypertensive, with heart failure (HCC)  - POCT Urinalysis Dipstick - Lipid Profile - Comp Met (CMET) - CBC  with Diff - TSH  3. Wellness examination  - POCT Urinalysis Dipstick - Lipid Profile - Comp Met (CMET) - CBC with Diff - TSH  4. Other hyperlipidemia  - POCT Urinalysis Dipstick - Lipid Profile - Comp Met (CMET) - CBC with Diff - TSH  5. Arteriosclerosis of coronary artery  - POCT Urinalysis Dipstick - Lipid Profile - Comp Met (CMET) - CBC with Diff - TSH   Richard Cranford Mon, MD  [facility]

## 2019-10-31 ENCOUNTER — Other Ambulatory Visit: Payer: Self-pay

## 2019-10-31 ENCOUNTER — Ambulatory Visit (INDEPENDENT_AMBULATORY_CARE_PROVIDER_SITE_OTHER): Payer: Medicare Other | Admitting: Family Medicine

## 2019-10-31 ENCOUNTER — Encounter: Payer: Self-pay | Admitting: Family Medicine

## 2019-10-31 VITALS — BP 122/68 | HR 61 | Temp 97.3°F | Resp 16 | Ht 69.0 in | Wt 165.0 lb

## 2019-10-31 DIAGNOSIS — Z0000 Encounter for general adult medical examination without abnormal findings: Secondary | ICD-10-CM

## 2019-10-31 DIAGNOSIS — I2510 Atherosclerotic heart disease of native coronary artery without angina pectoris: Secondary | ICD-10-CM

## 2019-10-31 DIAGNOSIS — E7849 Other hyperlipidemia: Secondary | ICD-10-CM

## 2019-10-31 DIAGNOSIS — I130 Hypertensive heart and chronic kidney disease with heart failure and stage 1 through stage 4 chronic kidney disease, or unspecified chronic kidney disease: Secondary | ICD-10-CM | POA: Diagnosis not present

## 2019-10-31 DIAGNOSIS — I10 Essential (primary) hypertension: Secondary | ICD-10-CM | POA: Diagnosis not present

## 2019-10-31 LAB — POCT URINALYSIS DIPSTICK
Glucose, UA: NEGATIVE
Ketones, UA: NEGATIVE
Leukocytes, UA: NEGATIVE
Nitrite, UA: NEGATIVE
Protein, UA: POSITIVE — AB
Spec Grav, UA: 1.025 (ref 1.010–1.025)
Urobilinogen, UA: 0.2 E.U./dL
pH, UA: 6.5 (ref 5.0–8.0)

## 2019-11-01 DIAGNOSIS — N402 Nodular prostate without lower urinary tract symptoms: Secondary | ICD-10-CM | POA: Diagnosis not present

## 2019-11-01 DIAGNOSIS — I10 Essential (primary) hypertension: Secondary | ICD-10-CM | POA: Diagnosis not present

## 2019-11-01 DIAGNOSIS — Z0000 Encounter for general adult medical examination without abnormal findings: Secondary | ICD-10-CM | POA: Diagnosis not present

## 2019-11-01 DIAGNOSIS — I130 Hypertensive heart and chronic kidney disease with heart failure and stage 1 through stage 4 chronic kidney disease, or unspecified chronic kidney disease: Secondary | ICD-10-CM | POA: Diagnosis not present

## 2019-11-02 LAB — COMPREHENSIVE METABOLIC PANEL
ALT: 11 IU/L (ref 0–44)
AST: 18 IU/L (ref 0–40)
Albumin/Globulin Ratio: 1.8 (ref 1.2–2.2)
Albumin: 4 g/dL (ref 3.6–4.6)
Alkaline Phosphatase: 162 IU/L — ABNORMAL HIGH (ref 39–117)
BUN/Creatinine Ratio: 16 (ref 10–24)
BUN: 31 mg/dL — ABNORMAL HIGH (ref 8–27)
Bilirubin Total: 0.3 mg/dL (ref 0.0–1.2)
CO2: 21 mmol/L (ref 20–29)
Calcium: 9.4 mg/dL (ref 8.6–10.2)
Chloride: 107 mmol/L — ABNORMAL HIGH (ref 96–106)
Creatinine, Ser: 1.93 mg/dL — ABNORMAL HIGH (ref 0.76–1.27)
GFR calc Af Amer: 36 mL/min/{1.73_m2} — ABNORMAL LOW (ref >59)
GFR calc non Af Amer: 31 mL/min/{1.73_m2} — ABNORMAL LOW (ref >59)
Globulin, Total: 2.2 g/dL (ref 1.5–4.5)
Glucose: 96 mg/dL (ref 65–99)
Potassium: 5.1 mmol/L (ref 3.5–5.2)
Sodium: 144 mmol/L (ref 134–144)
Total Protein: 6.2 g/dL (ref 6.0–8.5)

## 2019-11-02 LAB — CBC WITH DIFFERENTIAL/PLATELET
Basophils Absolute: 0.1 10*3/uL (ref 0.0–0.2)
Basos: 1 %
EOS (ABSOLUTE): 0.4 10*3/uL (ref 0.0–0.4)
Eos: 8 %
Hematocrit: 35.9 % — ABNORMAL LOW (ref 37.5–51.0)
Hemoglobin: 11.8 g/dL — ABNORMAL LOW (ref 13.0–17.7)
Immature Grans (Abs): 0 10*3/uL (ref 0.0–0.1)
Immature Granulocytes: 0 %
Lymphocytes Absolute: 1.2 10*3/uL (ref 0.7–3.1)
Lymphs: 20 %
MCH: 29.6 pg (ref 26.6–33.0)
MCHC: 32.9 g/dL (ref 31.5–35.7)
MCV: 90 fL (ref 79–97)
Monocytes Absolute: 0.4 10*3/uL (ref 0.1–0.9)
Monocytes: 8 %
Neutrophils Absolute: 3.6 10*3/uL (ref 1.4–7.0)
Neutrophils: 63 %
Platelets: 193 10*3/uL (ref 150–450)
RBC: 3.98 x10E6/uL — ABNORMAL LOW (ref 4.14–5.80)
RDW: 14.1 % (ref 11.6–15.4)
WBC: 5.7 10*3/uL (ref 3.4–10.8)

## 2019-11-02 LAB — TSH: TSH: 2.17 u[IU]/mL (ref 0.450–4.500)

## 2019-11-02 LAB — LIPID PANEL
Chol/HDL Ratio: 3.5 ratio (ref 0.0–5.0)
Cholesterol, Total: 147 mg/dL (ref 100–199)
HDL: 42 mg/dL (ref >39)
LDL Chol Calc (NIH): 78 mg/dL (ref 0–99)
Triglycerides: 156 mg/dL — ABNORMAL HIGH (ref 0–149)
VLDL Cholesterol Cal: 27 mg/dL (ref 5–40)

## 2019-11-06 ENCOUNTER — Encounter: Payer: Medicare Other | Admitting: Family Medicine

## 2020-01-14 DIAGNOSIS — Z48812 Encounter for surgical aftercare following surgery on the circulatory system: Secondary | ICD-10-CM | POA: Diagnosis not present

## 2020-01-14 DIAGNOSIS — Z95828 Presence of other vascular implants and grafts: Secondary | ICD-10-CM | POA: Diagnosis not present

## 2020-01-14 DIAGNOSIS — I724 Aneurysm of artery of lower extremity: Secondary | ICD-10-CM | POA: Diagnosis not present

## 2020-01-14 DIAGNOSIS — Z9889 Other specified postprocedural states: Secondary | ICD-10-CM | POA: Diagnosis not present

## 2020-01-14 DIAGNOSIS — Z4889 Encounter for other specified surgical aftercare: Secondary | ICD-10-CM | POA: Diagnosis not present

## 2020-01-21 DIAGNOSIS — N189 Chronic kidney disease, unspecified: Secondary | ICD-10-CM | POA: Diagnosis not present

## 2020-01-21 DIAGNOSIS — E875 Hyperkalemia: Secondary | ICD-10-CM | POA: Diagnosis not present

## 2020-01-21 DIAGNOSIS — N184 Chronic kidney disease, stage 4 (severe): Secondary | ICD-10-CM | POA: Diagnosis not present

## 2020-01-21 DIAGNOSIS — I10 Essential (primary) hypertension: Secondary | ICD-10-CM | POA: Diagnosis not present

## 2020-03-16 IMAGING — CR DG KNEE COMPLETE 4+V*L*
1 series · 4 of 4 positions shown · non-contrast
Comparison: None.

CLINICAL DATA: Persistent medial knee pain, initial encounter

EXAM:
LEFT KNEE - COMPLETE 4+ VIEW

[Series 1: dg knee complete 4 views left · 0.14mm/px · 4 of 4 slices shown]
[im 1/4]
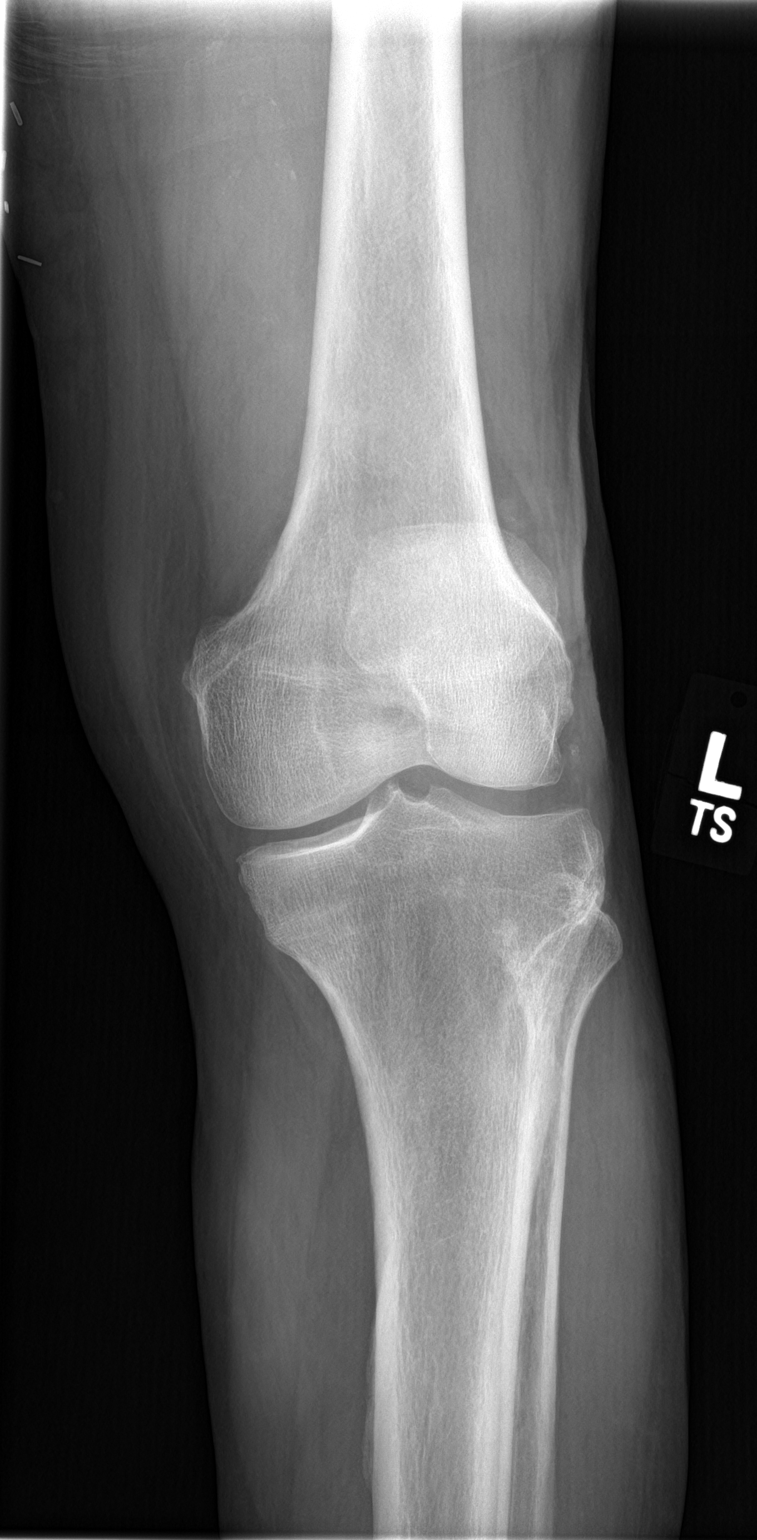
[im 2/4]
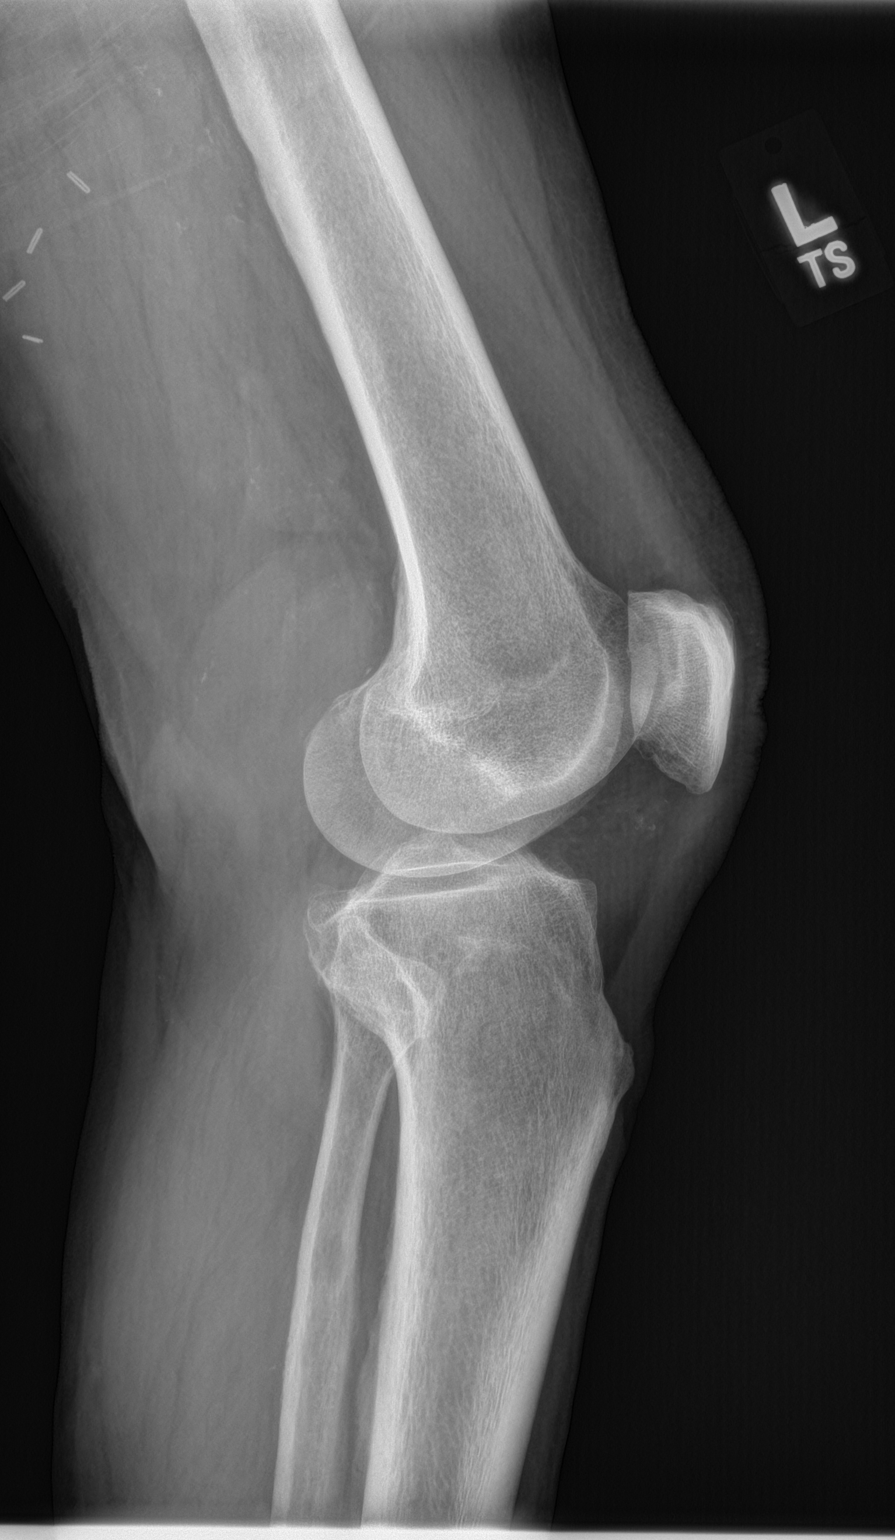
[im 3/4]
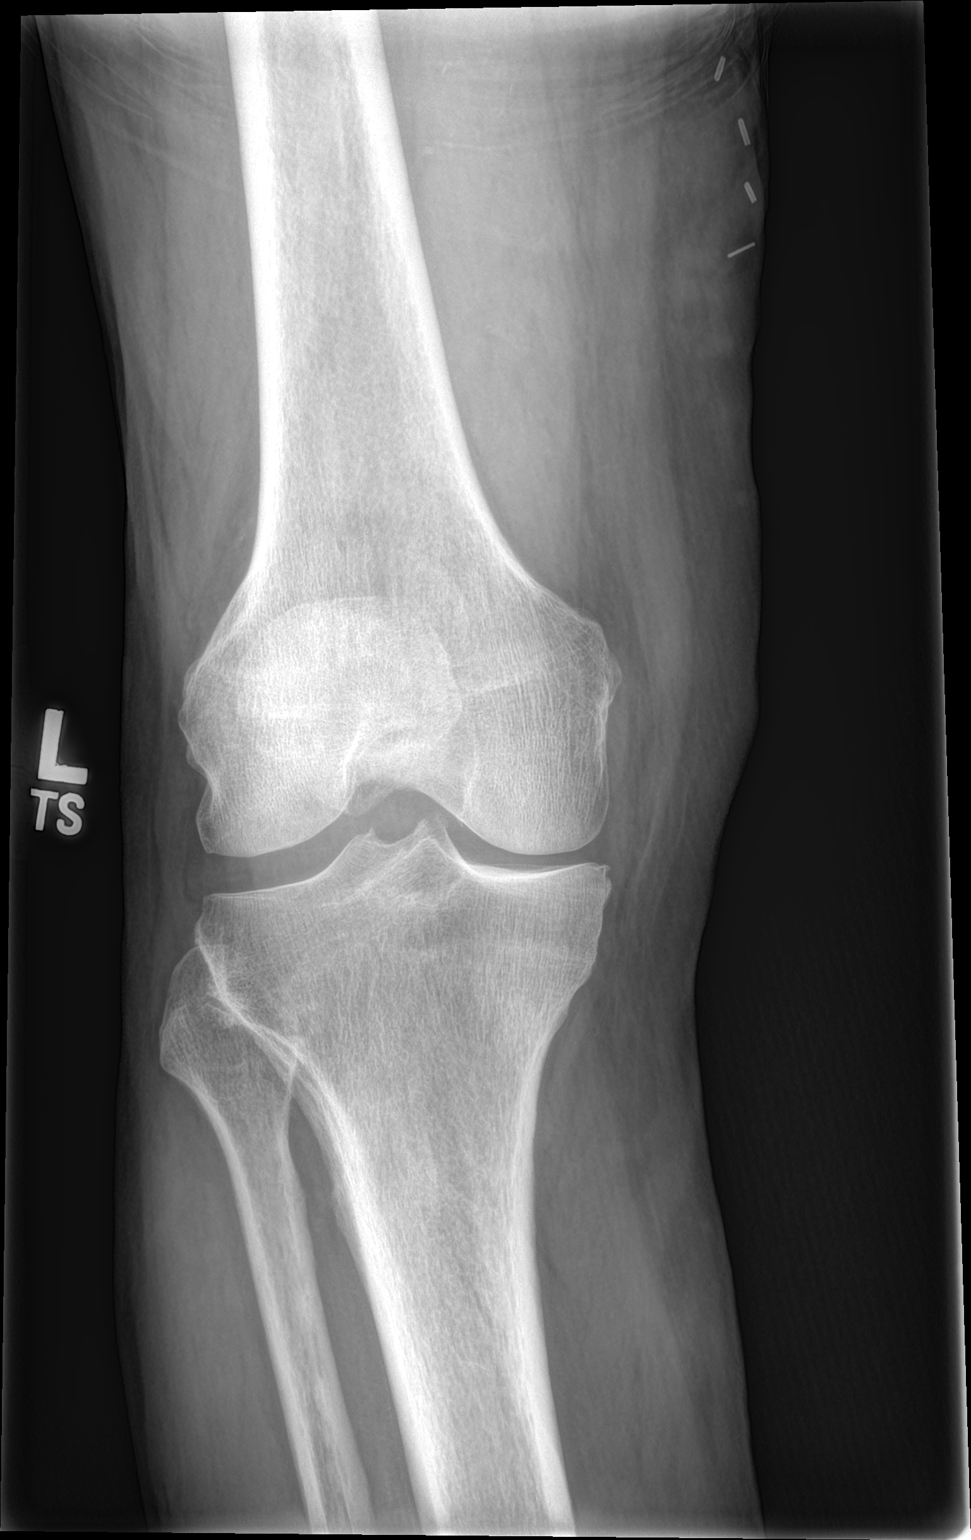
[im 4/4]
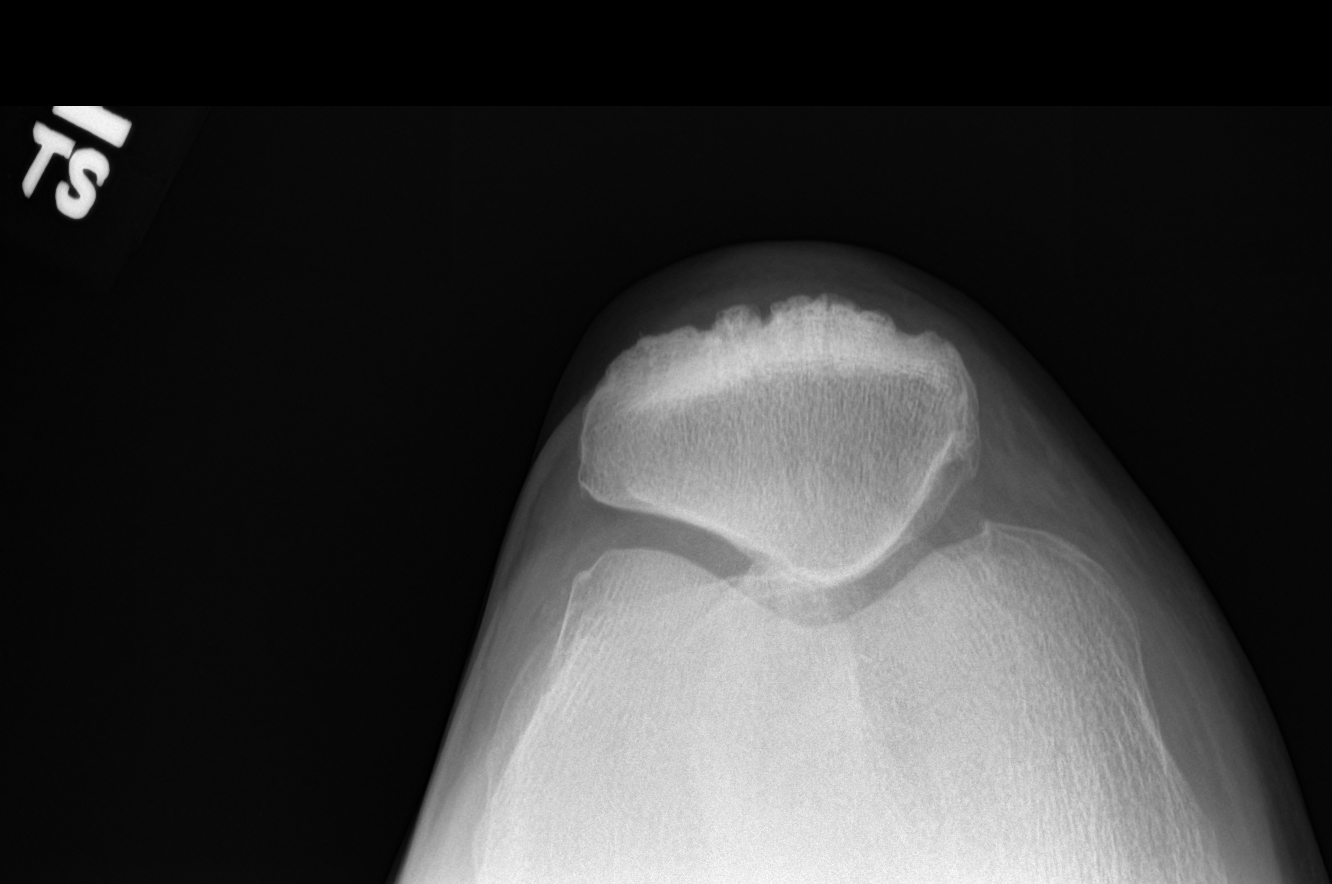

[4 of 4 positions shown; findings below may reference images not displayed]

FINDINGS: No acute fracture or dislocation is noted. No joint effusion is
seen. Mild soft tissue prominence is noted posteriorly which may
represent a popliteal cyst. Ultrasound may be helpful for further
evaluation. Mild medial joint space narrowing is noted.
IMPRESSION: Mild degenerative change.

Soft tissue prominence posteriorly of uncertain significance. This
may represent a popliteal cyst. Possibility of a popliteal aneurysm
would deserve consideration as well. Ultrasound evaluation is
recommended.

## 2020-06-02 ENCOUNTER — Other Ambulatory Visit: Payer: Self-pay | Admitting: Family Medicine

## 2020-08-06 DIAGNOSIS — I10 Essential (primary) hypertension: Secondary | ICD-10-CM | POA: Diagnosis not present

## 2020-08-06 DIAGNOSIS — I2510 Atherosclerotic heart disease of native coronary artery without angina pectoris: Secondary | ICD-10-CM | POA: Diagnosis not present

## 2020-09-11 DIAGNOSIS — L821 Other seborrheic keratosis: Secondary | ICD-10-CM | POA: Diagnosis not present

## 2020-09-11 DIAGNOSIS — L570 Actinic keratosis: Secondary | ICD-10-CM | POA: Diagnosis not present

## 2020-09-22 DIAGNOSIS — N1832 Chronic kidney disease, stage 3b: Secondary | ICD-10-CM | POA: Diagnosis not present

## 2020-09-22 DIAGNOSIS — Z23 Encounter for immunization: Secondary | ICD-10-CM | POA: Diagnosis not present

## 2020-09-22 DIAGNOSIS — I10 Essential (primary) hypertension: Secondary | ICD-10-CM | POA: Diagnosis not present

## 2020-11-03 ENCOUNTER — Encounter: Payer: Self-pay | Admitting: Family Medicine

## 2021-01-14 ENCOUNTER — Ambulatory Visit (INDEPENDENT_AMBULATORY_CARE_PROVIDER_SITE_OTHER): Payer: Medicare Other | Admitting: Family Medicine

## 2021-01-14 ENCOUNTER — Encounter: Payer: Self-pay | Admitting: Family Medicine

## 2021-01-14 ENCOUNTER — Other Ambulatory Visit: Payer: Self-pay

## 2021-01-14 VITALS — BP 144/78 | HR 62 | Temp 98.0°F | Resp 16 | Ht 69.0 in | Wt 163.0 lb

## 2021-01-14 DIAGNOSIS — E7849 Other hyperlipidemia: Secondary | ICD-10-CM

## 2021-01-14 DIAGNOSIS — N183 Chronic kidney disease, stage 3 unspecified: Secondary | ICD-10-CM | POA: Diagnosis not present

## 2021-01-14 DIAGNOSIS — I10 Essential (primary) hypertension: Secondary | ICD-10-CM | POA: Diagnosis not present

## 2021-01-14 DIAGNOSIS — Z0000 Encounter for general adult medical examination without abnormal findings: Secondary | ICD-10-CM

## 2021-01-14 NOTE — Progress Notes (Signed)
I,Eric Mccall,acting as a scribe for Megan Mans, MD.,have documented all relevant documentation on the behalf of Megan Mans, MD,as directed by  Megan Mans, MD while in the presence of Megan Mans, MD.   Complete physical exam   Patient: Eric Mccall   DOB: 1934-04-10   87 y.o. Male  MRN: [id number] Visit Date: 01/14/2021  Today's healthcare provider: Megan Mans, MD   Chief Complaint  Patient presents with  . Annual Exam   Subjective    Eric Mccall is a 87 y.o. male who presents today for a complete physical exam.  He reports consuming a general and low sodium diet. Home exercise routine includes walking. He generally feels well. He reports sleeping fairly well. He does not have additional problems to discuss today.  He is doing very well. HPI    History reviewed. No pertinent past medical history. Past Surgical History:  Procedure Laterality Date  . CORONARY ARTERY BYPASS GRAFT     Social History   Socioeconomic History  . Marital status: Married    Spouse name: Not on file  . Number of children: Not on file  . Years of education: Not on file  . Highest education level: Not on file  Occupational History  . Not on file  Tobacco Use  . Smoking status: Never Smoker  . Smokeless tobacco: Never Used  Vaping Use  . Vaping Use: Never used  Substance and Sexual Activity  . Alcohol use: Yes    Comment: monthly or less, 1 to 2 drinks at a time then  . Drug use: No  . Sexual activity: Not on file  Other Topics Concern  . Not on file  Social History Narrative  . Not on file   Social Determinants of Health   Financial Resource Strain: Not on file  Food Insecurity: Not on file  Transportation Needs: Not on file  Physical Activity: Not on file  Stress: Not on file  Social Connections: Not on file  Intimate Partner Violence: Not on file   Family Status  Relation Name Status  . Mother  Deceased at age 10  . Father   Deceased at age 41  . Sister  Deceased at age 47  . Daughter  Alive  . Sister  Alive   Family History  Problem Relation Age of Onset  . Cancer Mother   . Depression Mother   . Heart disease Father   . Heart attack Father   . Breast cancer Sister    No Known Allergies  Patient Care Team: Maple Hudson., MD as PCP - General (Family Medicine)   Medications: Outpatient Medications Prior to Visit  Medication Sig  . amLODipine (NORVASC) 5 MG tablet Take by mouth.  Marland Kitchen aspirin 81 MG tablet Take by mouth.  Marland Kitchen atorvastatin (LIPITOR) 40 MG tablet TAKE 1 TABLET(40 MG) BY MOUTH DAILY  . clopidogrel (PLAVIX) 75 MG tablet Take by mouth.  . metoprolol succinate (TOPROL-XL) 25 MG 24 hr tablet Take by mouth.  Marland Kitchen omeprazole (PRILOSEC) 20 MG capsule TAKE ONE CAPSULE BY MOUTH DAILY  . ramipril (ALTACE) 2.5 MG capsule Take by mouth.  . sodium bicarbonate 650 MG tablet Take 650 mg by mouth 2 (two) times daily.  . Vitamin D, Cholecalciferol, 50 MCG (2000 UT) CAPS Take by mouth.   No facility-administered medications prior to visit.    Review of Systems  All other systems reviewed and are negative.  Objective    BP (!) 144/78 (BP Location: Left Arm, Patient Position: Sitting, Cuff Size: Large)   Pulse 62   Temp 98 F (36.7 C) (Oral)   Resp 16   Ht 5\' 9"  (1.753 m)   Wt 163 lb (73.9 kg)   SpO2 98%   BMI 24.07 kg/m  BP Readings from Last 3 Encounters:  01/14/21 (!) 144/78  10/31/19 122/68  09/06/18 128/68   Wt Readings from Last 3 Encounters:  01/14/21 163 lb (73.9 kg)  10/31/19 165 lb (74.8 kg)  09/06/18 163 lb (73.9 kg)      Physical Exam Vitals reviewed.  Constitutional:      Appearance: He is well-developed.  HENT:     Head: Normocephalic and atraumatic.     Right Ear: External ear normal.     Left Ear: External ear normal.     Nose: Nose normal.  Eyes:     General: No scleral icterus.    Conjunctiva/sclera: Conjunctivae normal.  Neck:     Thyroid: No  thyromegaly.  Cardiovascular:     Rate and Rhythm: Normal rate and regular rhythm.     Heart sounds: Normal heart sounds.  Pulmonary:     Effort: Pulmonary effort is normal.     Breath sounds: Normal breath sounds.  Abdominal:     Palpations: Abdomen is soft.  Lymphadenopathy:     Cervical: No cervical adenopathy.  Skin:    General: Skin is warm and dry.  Neurological:     General: No focal deficit present.     Mental Status: He is alert and oriented to person, place, and time.  Psychiatric:        Mood and Affect: Mood normal.        Behavior: Behavior normal.        Thought Content: Thought content normal.        Judgment: Judgment normal.       Last depression screening scores PHQ 2/9 Scores 01/14/2021 10/31/2019 12/07/2017  PHQ - 2 Score 0 0 0  PHQ- 9 Score 0 2 0   Last fall risk screening Fall Risk  01/14/2021  Falls in the past year? 0  Number falls in past yr: 0  Injury with Fall? 0  Follow up Falls evaluation completed   Last Audit-C alcohol use screening Alcohol Use Disorder Test (AUDIT) 01/14/2021  1. How often do you have a drink containing alcohol? 0  2. How many drinks containing alcohol do you have on a typical day when you are drinking? 0  3. How often do you have six or more drinks on one occasion? 0  AUDIT-C Score 0  Alcohol Brief Interventions/Follow-up AUDIT Score <7 follow-up not indicated   A score of 3 or more in women, and 4 or more in men indicates increased risk for alcohol abuse, EXCEPT if all of the points are from question 1   No results found for any visits on 01/14/21.  Assessment & Plan    Routine Health Maintenance and Physical Exam  Exercise Activities and Dietary recommendations Goals   None     Immunization History  Administered Date(s) Administered  . Fluad Quad(high Dose 65+) 08/22/2019  . Influenza, High Dose Seasonal PF 09/25/2015, 09/30/2016, 09/06/2018  . Pneumococcal Conjugate-13 11/05/2014  . Pneumococcal  Polysaccharide-23 10/21/2004  . Td 05/23/2012  . Zoster 08/26/2011    Health Maintenance  Topic Date Due  . COVID-19 Vaccine (1) Never done  . INFLUENZA VACCINE  07/13/2020  .  TETANUS/TDAP  05/23/2022  . PNA vac Low Risk Adult  Completed    Discussed health benefits of physical activity, and encouraged him to engage in regular exercise appropriate for his age and condition.  1. Annual physical exam Follow-up 1 year.  Advised patient to remain active as he has been doing.  2. Essential (primary) hypertension  - TSH - CBC w/Diff/Platelet - Comprehensive Metabolic Panel (CMET)  3. Other hyperlipidemia  - Lipid panel - TSH - Comprehensive Metabolic Panel (CMET)  4. Stage 3 chronic kidney disease, unspecified whether stage 3a or 3b CKD (HCC)  - CBC w/Diff/Platelet - Comprehensive Metabolic Panel (CMET)   No follow-ups on file.     I, Megan Mans, MD, have reviewed all documentation for this visit. The documentation on 01/21/21 for the exam, diagnosis, procedures, and orders are all accurate and complete.    Colby Catanese Wendelyn Breslow, MD  [facility] [phone] (phone) [phone] (fax)  [facility]

## 2021-01-15 LAB — LIPID PANEL
Chol/HDL Ratio: 3.7 ratio (ref 0.0–5.0)
Cholesterol, Total: 129 mg/dL (ref 100–199)
HDL: 35 mg/dL — ABNORMAL LOW (ref >39)
LDL Chol Calc (NIH): 48 mg/dL (ref 0–99)
Triglycerides: 302 mg/dL — ABNORMAL HIGH (ref 0–149)
VLDL Cholesterol Cal: 46 mg/dL — ABNORMAL HIGH (ref 5–40)

## 2021-01-15 LAB — CBC WITH DIFFERENTIAL/PLATELET
Basophils Absolute: 0.1 10*3/uL (ref 0.0–0.2)
Basos: 1 %
EOS (ABSOLUTE): 0.4 10*3/uL (ref 0.0–0.4)
Eos: 6 %
Hematocrit: 36.1 % — ABNORMAL LOW (ref 37.5–51.0)
Hemoglobin: 11.9 g/dL — ABNORMAL LOW (ref 13.0–17.7)
Immature Grans (Abs): 0 10*3/uL (ref 0.0–0.1)
Immature Granulocytes: 0 %
Lymphocytes Absolute: 1.4 10*3/uL (ref 0.7–3.1)
Lymphs: 20 %
MCH: 31.4 pg (ref 26.6–33.0)
MCHC: 33 g/dL (ref 31.5–35.7)
MCV: 95 fL (ref 79–97)
Monocytes Absolute: 0.6 10*3/uL (ref 0.1–0.9)
Monocytes: 9 %
Neutrophils Absolute: 4.3 10*3/uL (ref 1.4–7.0)
Neutrophils: 64 %
Platelets: 194 10*3/uL (ref 150–450)
RBC: 3.79 x10E6/uL — ABNORMAL LOW (ref 4.14–5.80)
RDW: 12.6 % (ref 11.6–15.4)
WBC: 6.7 10*3/uL (ref 3.4–10.8)

## 2021-01-15 LAB — COMPREHENSIVE METABOLIC PANEL
ALT: 10 IU/L (ref 0–44)
AST: 19 IU/L (ref 0–40)
Albumin/Globulin Ratio: 1.5 (ref 1.2–2.2)
Albumin: 4 g/dL (ref 3.6–4.6)
Alkaline Phosphatase: 107 IU/L (ref 44–121)
BUN/Creatinine Ratio: 19 (ref 10–24)
BUN: 41 mg/dL — ABNORMAL HIGH (ref 8–27)
Bilirubin Total: 0.3 mg/dL (ref 0.0–1.2)
CO2: 20 mmol/L (ref 20–29)
Calcium: 8.8 mg/dL (ref 8.6–10.2)
Chloride: 107 mmol/L — ABNORMAL HIGH (ref 96–106)
Creatinine, Ser: 2.15 mg/dL — ABNORMAL HIGH (ref 0.76–1.27)
GFR calc Af Amer: 32 mL/min/{1.73_m2} — ABNORMAL LOW (ref >59)
GFR calc non Af Amer: 27 mL/min/{1.73_m2} — ABNORMAL LOW (ref >59)
Globulin, Total: 2.7 g/dL (ref 1.5–4.5)
Glucose: 86 mg/dL (ref 65–99)
Potassium: 4.8 mmol/L (ref 3.5–5.2)
Sodium: 141 mmol/L (ref 134–144)
Total Protein: 6.7 g/dL (ref 6.0–8.5)

## 2021-01-15 LAB — TSH: TSH: 2.24 u[IU]/mL (ref 0.450–4.500)

## 2021-01-30 DIAGNOSIS — Z79899 Other long term (current) drug therapy: Secondary | ICD-10-CM | POA: Diagnosis not present

## 2021-01-30 DIAGNOSIS — E1122 Type 2 diabetes mellitus with diabetic chronic kidney disease: Secondary | ICD-10-CM | POA: Diagnosis not present

## 2021-01-30 DIAGNOSIS — I10 Essential (primary) hypertension: Secondary | ICD-10-CM | POA: Diagnosis not present

## 2021-01-30 DIAGNOSIS — I129 Hypertensive chronic kidney disease with stage 1 through stage 4 chronic kidney disease, or unspecified chronic kidney disease: Secondary | ICD-10-CM | POA: Diagnosis not present

## 2021-01-30 DIAGNOSIS — N1832 Chronic kidney disease, stage 3b: Secondary | ICD-10-CM | POA: Diagnosis not present

## 2021-04-01 DIAGNOSIS — Z9889 Other specified postprocedural states: Secondary | ICD-10-CM | POA: Diagnosis not present

## 2021-04-01 DIAGNOSIS — Z48812 Encounter for surgical aftercare following surgery on the circulatory system: Secondary | ICD-10-CM | POA: Diagnosis not present

## 2021-04-01 DIAGNOSIS — I724 Aneurysm of artery of lower extremity: Secondary | ICD-10-CM | POA: Diagnosis not present

## 2021-04-01 DIAGNOSIS — Z95828 Presence of other vascular implants and grafts: Secondary | ICD-10-CM | POA: Diagnosis not present

## 2021-06-07 ENCOUNTER — Other Ambulatory Visit: Payer: Self-pay | Admitting: Family Medicine

## 2021-06-07 NOTE — Telephone Encounter (Signed)
Requested Prescriptions  Pending Prescriptions Disp Refills  . omeprazole (PRILOSEC) 20 MG capsule [Pharmacy Med Name: OMEPRAZOLE 20MG  CAPSULES] 90 capsule 1    Sig: TAKE ONE CAPSULE BY MOUTH DAILY     Gastroenterology: Proton Pump Inhibitors Failed - 06/07/2021  7:44 AM      Failed - Valid encounter within last 12 months    Recent Outpatient Visits          4 months ago Annual physical exam   [facility]   1 year ago Annual physical exam   [facility] [place]   2 years ago Chronic pain of left knee   [facility] MD   3 years ago Eye contusion, right, initial encounter   [facility] MD   3 years ago Wellness examination   [facility]      Future Appointments            In 7 months Maple Hudson., MD [facility]

## 2021-06-12 DIAGNOSIS — I129 Hypertensive chronic kidney disease with stage 1 through stage 4 chronic kidney disease, or unspecified chronic kidney disease: Secondary | ICD-10-CM | POA: Diagnosis not present

## 2021-06-12 DIAGNOSIS — Z8679 Personal history of other diseases of the circulatory system: Secondary | ICD-10-CM | POA: Diagnosis not present

## 2021-06-12 DIAGNOSIS — I739 Peripheral vascular disease, unspecified: Secondary | ICD-10-CM | POA: Diagnosis not present

## 2021-06-12 DIAGNOSIS — U071 COVID-19: Secondary | ICD-10-CM | POA: Diagnosis not present

## 2021-06-12 DIAGNOSIS — Z7982 Long term (current) use of aspirin: Secondary | ICD-10-CM | POA: Diagnosis not present

## 2021-06-12 DIAGNOSIS — I9789 Other postprocedural complications and disorders of the circulatory system, not elsewhere classified: Secondary | ICD-10-CM | POA: Diagnosis not present

## 2021-06-12 DIAGNOSIS — N400 Enlarged prostate without lower urinary tract symptoms: Secondary | ICD-10-CM | POA: Diagnosis not present

## 2021-06-12 DIAGNOSIS — I2510 Atherosclerotic heart disease of native coronary artery without angina pectoris: Secondary | ICD-10-CM | POA: Diagnosis not present

## 2021-06-12 DIAGNOSIS — E785 Hyperlipidemia, unspecified: Secondary | ICD-10-CM | POA: Diagnosis not present

## 2021-06-12 DIAGNOSIS — Z951 Presence of aortocoronary bypass graft: Secondary | ICD-10-CM | POA: Diagnosis not present

## 2021-06-12 DIAGNOSIS — N1832 Chronic kidney disease, stage 3b: Secondary | ICD-10-CM | POA: Diagnosis not present

## 2021-06-12 DIAGNOSIS — Z79899 Other long term (current) drug therapy: Secondary | ICD-10-CM | POA: Diagnosis not present

## 2021-06-12 DIAGNOSIS — Z9889 Other specified postprocedural states: Secondary | ICD-10-CM | POA: Diagnosis not present

## 2021-06-13 DIAGNOSIS — Z9889 Other specified postprocedural states: Secondary | ICD-10-CM | POA: Diagnosis not present

## 2021-06-13 DIAGNOSIS — N1832 Chronic kidney disease, stage 3b: Secondary | ICD-10-CM | POA: Diagnosis not present

## 2021-06-13 DIAGNOSIS — Z7982 Long term (current) use of aspirin: Secondary | ICD-10-CM | POA: Diagnosis not present

## 2021-06-13 DIAGNOSIS — U071 COVID-19: Secondary | ICD-10-CM | POA: Diagnosis not present

## 2021-06-13 DIAGNOSIS — E785 Hyperlipidemia, unspecified: Secondary | ICD-10-CM | POA: Diagnosis not present

## 2021-06-13 DIAGNOSIS — I739 Peripheral vascular disease, unspecified: Secondary | ICD-10-CM | POA: Diagnosis not present

## 2021-06-13 DIAGNOSIS — N400 Enlarged prostate without lower urinary tract symptoms: Secondary | ICD-10-CM | POA: Diagnosis not present

## 2021-06-13 DIAGNOSIS — I724 Aneurysm of artery of lower extremity: Secondary | ICD-10-CM | POA: Diagnosis not present

## 2021-06-13 DIAGNOSIS — I129 Hypertensive chronic kidney disease with stage 1 through stage 4 chronic kidney disease, or unspecified chronic kidney disease: Secondary | ICD-10-CM | POA: Diagnosis not present

## 2021-06-13 DIAGNOSIS — I2510 Atherosclerotic heart disease of native coronary artery without angina pectoris: Secondary | ICD-10-CM | POA: Diagnosis not present

## 2021-06-13 DIAGNOSIS — Z79899 Other long term (current) drug therapy: Secondary | ICD-10-CM | POA: Diagnosis not present

## 2021-06-13 DIAGNOSIS — I714 Abdominal aortic aneurysm, without rupture: Secondary | ICD-10-CM | POA: Diagnosis not present

## 2021-06-13 DIAGNOSIS — Z8679 Personal history of other diseases of the circulatory system: Secondary | ICD-10-CM | POA: Diagnosis not present

## 2021-06-13 DIAGNOSIS — Z951 Presence of aortocoronary bypass graft: Secondary | ICD-10-CM | POA: Diagnosis not present

## 2021-06-13 DIAGNOSIS — I9789 Other postprocedural complications and disorders of the circulatory system, not elsewhere classified: Secondary | ICD-10-CM | POA: Diagnosis not present

## 2021-06-14 DIAGNOSIS — Z8679 Personal history of other diseases of the circulatory system: Secondary | ICD-10-CM | POA: Diagnosis not present

## 2021-06-14 DIAGNOSIS — I129 Hypertensive chronic kidney disease with stage 1 through stage 4 chronic kidney disease, or unspecified chronic kidney disease: Secondary | ICD-10-CM | POA: Diagnosis not present

## 2021-06-14 DIAGNOSIS — Z79899 Other long term (current) drug therapy: Secondary | ICD-10-CM | POA: Diagnosis not present

## 2021-06-14 DIAGNOSIS — Z9889 Other specified postprocedural states: Secondary | ICD-10-CM | POA: Diagnosis not present

## 2021-06-14 DIAGNOSIS — Z7982 Long term (current) use of aspirin: Secondary | ICD-10-CM | POA: Diagnosis not present

## 2021-06-14 DIAGNOSIS — N1832 Chronic kidney disease, stage 3b: Secondary | ICD-10-CM | POA: Diagnosis not present

## 2021-06-14 DIAGNOSIS — I739 Peripheral vascular disease, unspecified: Secondary | ICD-10-CM | POA: Diagnosis not present

## 2021-06-14 DIAGNOSIS — N400 Enlarged prostate without lower urinary tract symptoms: Secondary | ICD-10-CM | POA: Diagnosis not present

## 2021-06-14 DIAGNOSIS — I2510 Atherosclerotic heart disease of native coronary artery without angina pectoris: Secondary | ICD-10-CM | POA: Diagnosis not present

## 2021-06-14 DIAGNOSIS — U071 COVID-19: Secondary | ICD-10-CM | POA: Diagnosis not present

## 2021-06-14 DIAGNOSIS — Z951 Presence of aortocoronary bypass graft: Secondary | ICD-10-CM | POA: Diagnosis not present

## 2021-06-14 DIAGNOSIS — I9789 Other postprocedural complications and disorders of the circulatory system, not elsewhere classified: Secondary | ICD-10-CM | POA: Diagnosis not present

## 2021-06-14 DIAGNOSIS — E785 Hyperlipidemia, unspecified: Secondary | ICD-10-CM | POA: Diagnosis not present

## 2021-07-17 DIAGNOSIS — T82858A Stenosis of vascular prosthetic devices, implants and grafts, initial encounter: Secondary | ICD-10-CM | POA: Diagnosis not present

## 2021-07-17 DIAGNOSIS — I724 Aneurysm of artery of lower extremity: Secondary | ICD-10-CM | POA: Diagnosis not present

## 2021-07-17 DIAGNOSIS — Z95828 Presence of other vascular implants and grafts: Secondary | ICD-10-CM | POA: Diagnosis not present

## 2021-07-17 DIAGNOSIS — I9789 Other postprocedural complications and disorders of the circulatory system, not elsewhere classified: Secondary | ICD-10-CM | POA: Diagnosis not present

## 2021-08-12 DIAGNOSIS — N183 Chronic kidney disease, stage 3 unspecified: Secondary | ICD-10-CM | POA: Diagnosis not present

## 2021-08-12 DIAGNOSIS — Z87898 Personal history of other specified conditions: Secondary | ICD-10-CM | POA: Diagnosis not present

## 2021-08-12 DIAGNOSIS — Z01818 Encounter for other preprocedural examination: Secondary | ICD-10-CM | POA: Diagnosis not present

## 2021-08-12 DIAGNOSIS — I10 Essential (primary) hypertension: Secondary | ICD-10-CM | POA: Diagnosis not present

## 2021-08-12 DIAGNOSIS — I2510 Atherosclerotic heart disease of native coronary artery without angina pectoris: Secondary | ICD-10-CM | POA: Diagnosis not present

## 2021-08-27 DIAGNOSIS — I70291 Other atherosclerosis of native arteries of extremities, right leg: Secondary | ICD-10-CM | POA: Diagnosis not present

## 2021-08-27 DIAGNOSIS — Z95828 Presence of other vascular implants and grafts: Secondary | ICD-10-CM | POA: Diagnosis not present

## 2021-08-27 DIAGNOSIS — I70301 Unspecified atherosclerosis of unspecified type of bypass graft(s) of the extremities, right leg: Secondary | ICD-10-CM | POA: Diagnosis not present

## 2021-08-27 DIAGNOSIS — I724 Aneurysm of artery of lower extremity: Secondary | ICD-10-CM | POA: Diagnosis not present

## 2021-08-31 DIAGNOSIS — N183 Chronic kidney disease, stage 3 unspecified: Secondary | ICD-10-CM | POA: Diagnosis not present

## 2021-08-31 DIAGNOSIS — I10 Essential (primary) hypertension: Secondary | ICD-10-CM | POA: Diagnosis not present

## 2021-08-31 DIAGNOSIS — E782 Mixed hyperlipidemia: Secondary | ICD-10-CM | POA: Diagnosis not present

## 2021-08-31 DIAGNOSIS — I2510 Atherosclerotic heart disease of native coronary artery without angina pectoris: Secondary | ICD-10-CM | POA: Diagnosis not present

## 2021-09-23 ENCOUNTER — Other Ambulatory Visit: Payer: Self-pay

## 2021-09-23 ENCOUNTER — Ambulatory Visit (INDEPENDENT_AMBULATORY_CARE_PROVIDER_SITE_OTHER): Payer: Medicare Other

## 2021-09-23 DIAGNOSIS — Z23 Encounter for immunization: Secondary | ICD-10-CM

## 2021-10-20 ENCOUNTER — Telehealth: Payer: Self-pay | Admitting: Family Medicine

## 2021-10-20 MED ORDER — OMEPRAZOLE 20 MG PO CPDR: 20.0000 mg | DELAYED_RELEASE_CAPSULE | Freq: Every day | ORAL | 1 refills | Status: DC

## 2021-10-20 NOTE — Telephone Encounter (Signed)
[facility] faxed refill request for the following medications:  omeprazole (PRILOSEC) 20 MG capsule     Please advise.  

## 2021-10-20 NOTE — Addendum Note (Signed)
Addended by: Marlene Lard on: 10/20/2021 11:06 AM   Modules accepted: Orders

## 2021-10-26 ENCOUNTER — Encounter: Payer: Self-pay | Admitting: *Deleted

## 2021-10-26 ENCOUNTER — Emergency Department
Admission: EM | Admit: 2021-10-26 | Discharge: 2021-10-26 | Disposition: A | Discharge status: Home / Self Care | Payer: Medicare Other | Attending: Emergency Medicine | Admitting: Emergency Medicine

## 2021-10-26 ENCOUNTER — Other Ambulatory Visit: Payer: Self-pay

## 2021-10-26 ENCOUNTER — Emergency Department: Payer: Medicare Other

## 2021-10-26 DIAGNOSIS — W540XXA Bitten by dog, initial encounter: Secondary | ICD-10-CM | POA: Diagnosis not present

## 2021-10-26 DIAGNOSIS — S6991XA Unspecified injury of right wrist, hand and finger(s), initial encounter: Secondary | ICD-10-CM | POA: Diagnosis present

## 2021-10-26 DIAGNOSIS — R600 Localized edema: Secondary | ICD-10-CM | POA: Diagnosis not present

## 2021-10-26 DIAGNOSIS — Z7982 Long term (current) use of aspirin: Secondary | ICD-10-CM | POA: Insufficient documentation

## 2021-10-26 DIAGNOSIS — S61451A Open bite of right hand, initial encounter: Secondary | ICD-10-CM | POA: Diagnosis not present

## 2021-10-26 DIAGNOSIS — Z951 Presence of aortocoronary bypass graft: Secondary | ICD-10-CM | POA: Insufficient documentation

## 2021-10-26 DIAGNOSIS — S61411A Laceration without foreign body of right hand, initial encounter: Secondary | ICD-10-CM | POA: Diagnosis not present

## 2021-10-26 DIAGNOSIS — Z79899 Other long term (current) drug therapy: Secondary | ICD-10-CM | POA: Diagnosis not present

## 2021-10-26 DIAGNOSIS — I10 Essential (primary) hypertension: Secondary | ICD-10-CM | POA: Insufficient documentation

## 2021-10-26 DIAGNOSIS — Z7901 Long term (current) use of anticoagulants: Secondary | ICD-10-CM | POA: Diagnosis not present

## 2021-10-26 MED ORDER — TETANUS-DIPHTH-ACELL PERTUSSIS 5-2.5-18.5 LF-MCG/0.5 IM SUSY: 0.5000 mL | PREFILLED_SYRINGE | Freq: Once | INTRAMUSCULAR | Status: DC

## 2021-10-26 MED ORDER — AMOXICILLIN-POT CLAVULANATE 875-125 MG PO TABS
1.0000 | ORAL_TABLET | Freq: Once | ORAL | Status: AC
  Administered 2021-10-26: 1 via ORAL
  Filled 2021-10-26: qty 1

## 2021-10-26 MED ORDER — AMOXICILLIN-POT CLAVULANATE 875-125 MG PO TABS: 1.0000 | ORAL_TABLET | Freq: Two times a day (BID) | ORAL | 0 refills | Status: AC

## 2021-10-26 NOTE — ED Triage Notes (Signed)
Pt to ED reporting he was bitten by a dog [facility] the [facility]. Pt was reassured by the shelter that the dog is up to date on all shots. Pt reporting 4 puncture marks in right hand. Bleeding under control with dressing.

## 2021-10-26 NOTE — ED Notes (Signed)
See triage note. Punctures on back of right hand and open laceration to palmar surface.

## 2021-10-26 NOTE — Discharge Instructions (Addendum)
Take Augmentin twice daily for ten days.  Keep wound clean and dry for the next 24 hours. After 24 hours, you can unwrap bulky dressing and inspect skin for any redness or expression of purulence. If you have any redness or fever, I need to see you back in the emergency department. Steri-strips will come off on their own.  After 24 hours, you can leave off splint.

## 2021-10-26 NOTE — ED Provider Notes (Signed)
[facility]-EMERGENCY DEPARTMENT  ____________________________________________  Time seen: Approximately 9:59 PM  I have reviewed the triage vital signs and the nursing notes.   HISTORY  Chief Complaint Animal Bite   Historian Patient     HPI Eric Mccall is a 87 y.o. male presents to the emergency department with multiple puncture wounds along the dorsal and volar aspect of the right hand after patient was bitten by a dog at a kennel where he was boarding his puppies.  Patient reports that dog is up-to-date on his rabies vaccine series and he does not wish to initiate the rabies vaccine series at this time.  Dog is available to be quarantined.  No numbness or tingling in the right hand.   History reviewed. No pertinent past medical history.   Immunizations up to date:  Yes.     History reviewed. No pertinent past medical history.  Patient Active Problem List   Diagnosis Date Noted   Popliteal artery aneurysm, bilateral ([place]) 03/22/2019   Red blood cell antibody positive 10/03/2017   Sternal wound dehiscence 09/27/2017   Abdominal aortic aneurysm (AAA) 11/13/2015   HLD (hyperlipidemia) 11/13/2015   Heart & renal disease, hypertensive, with heart failure ([place]) 11/13/2015   Irregular cardiac rhythm 11/13/2015   Cardiac ischemia 11/13/2015   Cataract, nuclear 11/13/2015   Chronic kidney disease (CKD), stage III (moderate) ([place]) 07/07/2015   Essential (primary) hypertension 07/07/2015   Benign fibroma of prostate 09/09/2014   NS (nuclear sclerosis) 09/06/2013   Keratoconus of both eyes 09/06/2013   Prostate nodule 09/06/2013   Arteriosclerosis of coronary artery 06/27/2012    Past Surgical History:  Procedure Laterality Date   CORONARY ARTERY BYPASS GRAFT      Prior to Admission medications   Medication Sig Start Date End Date Taking? Authorizing Provider  amoxicillin-clavulanate (AUGMENTIN) 875-125 MG tablet Take 1 tablet by mouth 2 (two) times daily for 10  days. 10/26/21 11/05/21 Yes Vallarie Mare M, PA-C  amLODipine (NORVASC) 5 MG tablet Take by mouth.    [provider]  aspirin 81 MG tablet Take by mouth.    [provider]  atorvastatin (LIPITOR) 40 MG tablet TAKE 1 TABLET(40 MG) BY MOUTH DAILY 06/02/20   Jerrol Banana., MD  clopidogrel (PLAVIX) 75 MG tablet Take by mouth.    [provider]  metoprolol succinate (TOPROL-XL) 25 MG 24 hr tablet Take by mouth.    [provider]  omeprazole (PRILOSEC) 20 MG capsule Take 1 capsule (20 mg total) by mouth daily. 10/20/21   Jerrol Banana., MD  ramipril (ALTACE) 2.5 MG capsule Take by mouth. 03/13/13   [provider]  sodium bicarbonate 650 MG tablet Take 650 mg by mouth 2 (two) times daily. 08/31/19   [provider]  Vitamin D, Cholecalciferol, 50 MCG (2000 UT) CAPS Take by mouth.    [provider]    Allergies Patient has no known allergies.  Family History  Problem Relation Age of Onset   Cancer Mother    Depression Mother    Heart disease Father    Heart attack Father    Breast cancer Sister     Social History Social History   Tobacco Use   Smoking status: Never   Smokeless tobacco: Never  Vaping Use   Vaping Use: Never used  Substance Use Topics   Alcohol use: Yes    Comment: monthly or less, 1 to 2 drinks at a time then   Drug use: No  Review of Systems  Constitutional: No fever/chills Eyes:  No discharge ENT: No upper respiratory complaints. Respiratory: no cough. No SOB/ use of accessory muscles to breath Gastrointestinal:   No nausea, no vomiting.  No diarrhea.  No constipation. Musculoskeletal: Patient has right hand pain.  Skin: Patient has dog bite.   ____________________________________________   PHYSICAL EXAM:  VITAL SIGNS: ED Triage Vitals  Enc Vitals Group     BP 10/26/21 1950 (!) 153/74     Pulse Rate 10/26/21 1950 60     Resp 10/26/21 1950 16     Temp 10/26/21 1950  98 F (36.7 C)     Temp Source 10/26/21 1950 Oral     SpO2 10/26/21 1950 100 %     Weight 10/26/21 1948 162 lb 14.7 oz (73.9 kg)     Height 10/26/21 1948 5\' 9"  (1.753 m)     Head Circumference --      Peak Flow --      Pain Score 10/26/21 2149 1     Pain Loc --      Pain Edu? --      Excl. in GC? --      Constitutional: Alert and oriented. Well appearing and in no acute distress. Eyes: Conjunctivae are normal. PERRL. EOMI. Head: Atraumatic. ENT: Cardiovascular: Normal rate, regular rhythm. Normal S1 and S2.  Good peripheral circulation. Respiratory: Normal respiratory effort without tachypnea or retractions. Lungs CTAB. Good air entry to the bases with no decreased or absent breath sounds Gastrointestinal: Bowel sounds x 4 quadrants. Soft and nontender to palpation. No guarding or rigidity. No distention. Musculoskeletal: Full range of motion to all extremities. No obvious deformities noted Neurologic:  Normal for age. No gross focal neurologic deficits are appreciated.  Skin: Patient has dog bite wound along the dorsal and volar aspect of the right hand.  Psychiatric: Mood and affect are normal for age. Speech and behavior are normal.   ____________________________________________   LABS (all labs ordered are listed, but only abnormal results are displayed)  Labs Reviewed - No data to display ____________________________________________  EKG   ____________________________________________  RADIOLOGY 2150, personally viewed and evaluated these images (plain radiographs) as part of my medical decision making, as well as reviewing the written report by the radiologist.  DG Hand Complete Right  Result Date: 10/26/2021 CLINICAL DATA:  Dog bite. Patient reports dog bite to the index finger today. Remote middle finger injury. EXAM: RIGHT HAND - COMPLETE 3+ VIEW COMPARISON:  None. FINDINGS: Remote amputation of the third digit at the proximal aspect of the proximal  phalanx. There is no acute fracture. Cortical irregularity about the distal fourth tuft appears chronic. Moderate osteoarthritis of the index finger and ring finger, with mild osteoarthritis of the thumb and pinky finger. Soft tissue edema about the index finger. No radiopaque foreign body. IMPRESSION: 1. Soft tissue edema about the index finger. No radiopaque foreign body. No acute fracture. 2. Remote amputation of the third digit at the proximal aspect of the proximal phalanx. 3. Osteoarthritis of the digits. Electronically Signed   By: 10/28/2021 M.D.   On: 10/26/2021 20:19    ____________________________________________    PROCEDURES  Procedure(s) performed:     11/16/2022Marland KitchenLaceration Repair  Date/Time: 10/26/2021 10:04 PM Performed by: 10/28/2021, PA-C Authorized by: Orvil Feil, PA-C   Consent:    Consent obtained:  Verbal   Risks discussed:  Infection and pain Universal protocol:    Procedure explained and questions  answered to patient or proxy's satisfaction: yes     Patient identity confirmed:  Verbally with patient Anesthesia:    Anesthesia method:  None Laceration details:    Location:  Hand   Hand location:  R palm Pre-procedure details:    Preparation:  Patient was prepped and draped in usual sterile fashion Exploration:    Limited defect created (wound extended): no     Contaminated: yes   Treatment:    Area cleansed with:  Saline   Irrigation volume:  500   Debridement:  None Skin repair:    Repair method:  Steri-Strips Approximation:    Approximation:  Loose Repair type:    Repair type:  Simple Post-procedure details:    Dressing:  Non-adherent dressing     Medications  amoxicillin-clavulanate (AUGMENTIN) 875-125 MG per tablet 1 tablet (1 tablet Oral Given 10/26/21 2138)     ____________________________________________   INITIAL IMPRESSION / ASSESSMENT AND PLAN / ED COURSE  Pertinent labs & imaging results that were available during my  care of the patient were reviewed by me and considered in my medical decision making (see chart for details).    Assessment and plan Puncture wounds 87 year old male presents to the emergency department with multiple puncture wounds along the volar and dorsal aspect of the right hand after he was bitten by a dog.  Dog is available to be quarantined for signs and symptoms of rabies and is up-to-date on rabies shots.  Patient does not wish to initiate series at this time.  There is no acute fracture or retained foreign bodies on x-ray.  Wounds were irrigated in the emergency department with normal saline and Steri-Strips were applied to puncture wounds.  Patient was started on Augmentin and given his first dose in the emergency department tonight.  He reported that his tetanus status was up-to-date.      ____________________________________________  FINAL CLINICAL IMPRESSION(S) / ED DIAGNOSES  Final diagnoses:  Dog bite, initial encounter      NEW MEDICATIONS STARTED DURING THIS VISIT:  ED Discharge Orders          Ordered    amoxicillin-clavulanate (AUGMENTIN) 875-125 MG tablet  2 times daily        10/26/21 2128                This chart was dictated using voice recognition software/Dragon. Despite best efforts to proofread, errors can occur which can change the meaning. Any change was purely unintentional.     Lannie Fields, PA-C 10/26/21 2210    Duffy Bruce, MD 10/26/21 2243

## 2021-10-28 ENCOUNTER — Ambulatory Visit: Payer: Self-pay | Admitting: *Deleted

## 2021-10-28 NOTE — Telephone Encounter (Signed)
Patient is calling to report he was bitten by dog on 11/14 and seen at ED- patient is requesting a follow up appointment in office for a wound check. Patient states his hand feels warm- but there is no discoloration or swelling.  Call to office- appointment scheduled for hand check Additional Information  Commented on: [1] Taking antibiotic < 48 hours for infected bite AND [2] fever persists    Patient states hand is warm to touch- request wound check  Answer Assessment - Initial Assessment Questions 1. LOCATION: "Where is the puncture located?"      R hand 2. OBJECT: "What was the object that punctured the skin?"      Dog teeth 3. DEPTH: "How deep do you think the puncture goes?"      Unsure- one puncture is deep 4. ONSET: "When did the injury occur?" (Minutes or hours)     11/14-ED 5. PAIN: "Is it painful?" If Yes, ask: "How bad is the pain?"  (Scale 1-10; or mild, moderate, severe)     mild 6. TETANUS: "When was the last tetanus booster?"     05/23/2012 7. PREGNANCY: "Is there any chance you are pregnant?" "When was your last menstrual period?"     na  Protocols used: Puncture Wound-A-AH, Animal Bite-A-AH

## 2021-10-29 ENCOUNTER — Encounter: Payer: Self-pay | Admitting: Family Medicine

## 2021-10-29 ENCOUNTER — Other Ambulatory Visit: Payer: Self-pay

## 2021-10-29 ENCOUNTER — Ambulatory Visit (INDEPENDENT_AMBULATORY_CARE_PROVIDER_SITE_OTHER): Payer: Medicare Other | Admitting: Family Medicine

## 2021-10-29 VITALS — BP 131/60 | HR 59 | Temp 97.7°F | Wt 164.0 lb

## 2021-10-29 DIAGNOSIS — S61411D Laceration without foreign body of right hand, subsequent encounter: Secondary | ICD-10-CM

## 2021-10-29 DIAGNOSIS — W540XXD Bitten by dog, subsequent encounter: Secondary | ICD-10-CM | POA: Diagnosis not present

## 2021-10-29 DIAGNOSIS — Z23 Encounter for immunization: Secondary | ICD-10-CM

## 2021-10-29 NOTE — Progress Notes (Signed)
Established patient visit   Patient: Eric Mccall   DOB: January 17, 1934   87 y.o. Male  MRN: [id number] Visit Date: 10/29/2021  Today's healthcare provider: Shirlee Latch, MD   Chief Complaint  Patient presents with   Animal Bite    Subjective    HPI  Follow up ER visit  Patient was seen in ER for dog bite on 10/26/21. He was treated for bite. Treatment for this included start augmentin. He reports excellent compliance with treatment. He reports this condition is Unchanged.  Pt states his hand seems more swollen yesterday and warm to the touch.  -----------------------------------------------------------------------------------------     Medications: Outpatient Medications Prior to Visit  Medication Sig   amLODipine (NORVASC) 5 MG tablet Take by mouth.   amoxicillin-clavulanate (AUGMENTIN) 875-125 MG tablet Take 1 tablet by mouth 2 (two) times daily for 10 days.   aspirin 81 MG tablet Take by mouth.   atorvastatin (LIPITOR) 40 MG tablet TAKE 1 TABLET(40 MG) BY MOUTH DAILY   clopidogrel (PLAVIX) 75 MG tablet Take by mouth.   metoprolol succinate (TOPROL-XL) 25 MG 24 hr tablet Take by mouth.   omeprazole (PRILOSEC) 20 MG capsule Take 1 capsule (20 mg total) by mouth daily.   ramipril (ALTACE) 2.5 MG capsule Take by mouth.   sodium bicarbonate 650 MG tablet Take 650 mg by mouth 2 (two) times daily.   Vitamin D, Cholecalciferol, 50 MCG (2000 UT) CAPS Take by mouth.   No facility-administered medications prior to visit.    Review of Systems  Constitutional: Negative.   Musculoskeletal:  Positive for joint swelling.   Last CBC Lab Results  Component Value Date   WBC 6.7 01/14/2021   HGB 11.9 (L) 01/14/2021   HCT 36.1 (L) 01/14/2021   MCV 95 01/14/2021   MCH 31.4 01/14/2021   RDW 12.6 01/14/2021   PLT 194 01/14/2021       Objective    BP 131/60 (BP Location: Right Arm, Patient Position: Sitting, Cuff Size: Large)   Pulse (!) 59   Temp 97.7 F  (36.5 C) (Oral)   Wt 164 lb (74.4 kg)   SpO2 98%   BMI 24.22 kg/m  BP Readings from Last 3 Encounters:  10/29/21 131/60  10/26/21 (!) 153/71  01/14/21 (!) 144/78   Wt Readings from Last 3 Encounters:  10/29/21 164 lb (74.4 kg)  10/26/21 162 lb 14.7 oz (73.9 kg)  01/14/21 163 lb (73.9 kg)      Physical Exam Constitutional:      General: He is not in acute distress.    Appearance: Normal appearance. He is not diaphoretic.  HENT:     Head: Normocephalic.  Eyes:     Conjunctiva/sclera: Conjunctivae normal.  Pulmonary:     Effort: Pulmonary effort is normal. No respiratory distress.  Skin:    Comments: Lacerations on the dorsal and volar aspects of right hand.  Covered in Steri-Strips.  No surrounding erythema.  Positive ecchymoses.  Normal movement in fingers, normal pulses and cap refill, normal sensation to light touch.  Neurological:     Mental Status: He is alert and oriented to person, place, and time. Mental status is at baseline.      No results found for any visits on 10/29/21.  Assessment & Plan     1. Laceration of right hand without foreign body, subsequent encounter 2. Dog bite, subsequent encounter -Seems to be healing well - Reviewed ER note and x-ray that shows no foreign  body - Finish Augmentin course - Needs Tdap today - Discussed return precautions   No follow-ups on file.      Total time spent on today's visit was greater than 30 minutes, including both face-to-face time and nonface-to-face time personally spent on review of chart (labs and imaging), discussing labs and goals, discussing further work-up, treatment options, referrals to specialist if needed, reviewing outside records of pertinent, answering patient's questions, and coordinating care.    I, Shirlee Latch, MD, have reviewed all documentation for this visit. The documentation on 10/29/21 for the exam, diagnosis, procedures, and orders are all accurate and complete.   Anetha Slagel,  Marzella Schlein, MD, MPH [facility]

## 2021-10-29 NOTE — Addendum Note (Signed)
Addended by: Kavin Leech E on: 10/29/2021 03:53 PM   Modules accepted: Orders

## 2022-01-20 ENCOUNTER — Other Ambulatory Visit: Payer: Self-pay

## 2022-01-20 ENCOUNTER — Encounter: Payer: Self-pay | Admitting: Family Medicine

## 2022-01-20 ENCOUNTER — Ambulatory Visit (INDEPENDENT_AMBULATORY_CARE_PROVIDER_SITE_OTHER): Payer: Medicare Other | Admitting: Family Medicine

## 2022-01-20 VITALS — BP 115/68 | HR 61 | Temp 98.1°F | Resp 16 | Ht 69.0 in | Wt 159.0 lb

## 2022-01-20 DIAGNOSIS — N183 Chronic kidney disease, stage 3 unspecified: Secondary | ICD-10-CM | POA: Diagnosis not present

## 2022-01-20 DIAGNOSIS — E7849 Other hyperlipidemia: Secondary | ICD-10-CM | POA: Diagnosis not present

## 2022-01-20 DIAGNOSIS — I10 Essential (primary) hypertension: Secondary | ICD-10-CM

## 2022-01-20 DIAGNOSIS — Z0000 Encounter for general adult medical examination without abnormal findings: Secondary | ICD-10-CM | POA: Diagnosis not present

## 2022-01-20 NOTE — Progress Notes (Signed)
Annual Wellness Visit    I,April Miller,acting as a scribe for Wilhemena Durie, MD.,have documented all relevant documentation on the behalf of Wilhemena Durie, MD,as directed by  Wilhemena Durie, MD while in the presence of Wilhemena Durie, MD.   Patient: Eric Mccall, Male    DOB: 12-08-1934, 88 y.o.   MRN: [id number] he and his wife continue to travel extensively Visit Date: 01/20/2022  Today's Provider: Wilhemena Durie, MD   Chief Complaint  Patient presents with   Medicare Wellness   Subjective    Eric Mccall is a 88 y.o. male who presents today for his Annual Wellness Visit. He reports consuming a general diet. Home exercise routine includes walks dogs. He generally feels well. He reports sleeping well. He does not have additional problems to discuss today.  He walks his dogs on his property several times a day.  His only issue is 1 of decreased energy over time.  He is able to do what he wants to do but does not have great stamina.  He is married and has 1 daughter and granddaughter that is a Designer, jewellery and clips and a grandson that is a Paramedic in [employer]. He and his wife continue to travel extensively HPI    Medications: Outpatient Medications Prior to Visit  Medication Sig   amLODipine (NORVASC) 5 MG tablet Take by mouth.   aspirin 81 MG tablet Take by mouth.   atorvastatin (LIPITOR) 40 MG tablet TAKE 1 TABLET(40 MG) BY MOUTH DAILY   clopidogrel (PLAVIX) 75 MG tablet Take by mouth.   metoprolol succinate (TOPROL-XL) 25 MG 24 hr tablet Take by mouth.   omeprazole (PRILOSEC) 20 MG capsule Take 1 capsule (20 mg total) by mouth daily.   ramipril (ALTACE) 2.5 MG capsule Take by mouth.   sodium bicarbonate 650 MG tablet Take 650 mg by mouth 2 (two) times daily.   Vitamin D, Cholecalciferol, 50 MCG (2000 UT) CAPS Take by mouth.   No facility-administered medications prior to visit.    No Known Allergies  Patient Care Team: Jerrol Banana., MD as PCP - General (Family Medicine)  Review of Systems  Constitutional:  Positive for fatigue.  HENT:  Positive for hearing loss.   Hematological:  Bruises/bleeds easily.  All other systems reviewed and are negative.  Last lipids Lab Results  Component Value Date   CHOL 124 01/20/2022   HDL 41 01/20/2022   LDLCALC 53 01/20/2022   TRIG 180 (H) 01/20/2022   CHOLHDL 3.0 01/20/2022        Objective    Vitals: BP 115/68 (BP Location: Right Arm, Patient Position: Sitting, Cuff Size: Normal)    Pulse 61    Temp 98.1 F (36.7 C) (Temporal)    Resp 16    Ht 5\' 9"  (1.753 m)    Wt 159 lb (72.1 kg)    SpO2 97%    BMI 23.48 kg/m  BP Readings from Last 3 Encounters:  01/20/22 115/68  10/29/21 131/60  10/26/21 (!) 153/71   Wt Readings from Last 3 Encounters:  01/20/22 159 lb (72.1 kg)  10/29/21 164 lb (74.4 kg)  10/26/21 162 lb 14.7 oz (73.9 kg)      Physical Exam Vitals reviewed.  Constitutional:      Appearance: He is well-developed.  HENT:     Head: Normocephalic and atraumatic.     Right Ear: External ear normal.     Left Ear:  External ear normal.     Nose: Nose normal.  Eyes:     General: No scleral icterus.    Conjunctiva/sclera: Conjunctivae normal.  Neck:     Thyroid: No thyromegaly.  Cardiovascular:     Rate and Rhythm: Normal rate and regular rhythm.     Heart sounds: Normal heart sounds.  Pulmonary:     Effort: Pulmonary effort is normal.     Breath sounds: Normal breath sounds.  Abdominal:     Palpations: Abdomen is soft.  Lymphadenopathy:     Cervical: No cervical adenopathy.  Skin:    General: Skin is warm and dry.  Neurological:     General: No focal deficit present.     Mental Status: He is alert and oriented to person, place, and time.  Psychiatric:        Mood and Affect: Mood normal.        Behavior: Behavior normal.        Thought Content: Thought content normal.        Judgment: Judgment normal.     Most recent functional status  assessment: In your present state of health, do you have any difficulty performing the following activities: 01/20/2022  Hearing? Y  Vision? N  Difficulty concentrating or making decisions? N  Walking or climbing stairs? N  Dressing or bathing? N  Doing errands, shopping? N  Some recent data might be hidden   Most recent fall risk assessment: Fall Risk  01/20/2022  Falls in the past year? 0  Number falls in past yr: 0  Injury with Fall? 0  Risk for fall due to : No Fall Risks  Follow up Falls evaluation completed    Most recent depression screenings: PHQ 2/9 Scores 01/20/2022 10/29/2021  PHQ - 2 Score 0 0  PHQ- 9 Score 1 0   Most recent cognitive screening: 6CIT Screen 11/24/2016  What Year? 0 points  What month? 0 points  What time? 0 points  Count back from 20 0 points  Months in reverse 0 points  Repeat phrase 0 points  Total Score 0   Most recent Audit-C alcohol use screening Alcohol Use Disorder Test (AUDIT) 01/20/2022  1. How often do you have a drink containing alcohol? 0  2. How many drinks containing alcohol do you have on a typical day when you are drinking? 0  3. How often do you have six or more drinks on one occasion? 0  AUDIT-C Score 0  Alcohol Brief Interventions/Follow-up -   A score of 3 or more in women, and 4 or more in men indicates increased risk for alcohol abuse, EXCEPT if all of the points are from question 1   No results found for any visits on 01/20/22.  Assessment & Plan     Annual wellness visit done today including the all of the following: Reviewed patient's Family Medical History Reviewed and updated list of patient's medical providers Assessment of cognitive impairment was done Assessed patient's functional ability Established a written schedule for health screening services Health Risk Assessent Completed and Reviewed  Exercise Activities and Dietary recommendations  Goals   None     Immunization History  Administered Date(s)  Administered   Fluad Quad(high Dose 65+) 08/22/2019, 09/23/2021   Influenza, High Dose Seasonal PF 09/25/2015, 09/30/2016, 09/06/2018   Moderna Sars-Covid-2 Vaccination 03/06/2020, 03/29/2020   Pneumococcal Conjugate-13 11/05/2014   Pneumococcal Polysaccharide-23 10/21/2004   Td 05/23/2012   Tdap 10/29/2021   Zoster, Live 08/26/2011  Health Maintenance  Topic Date Due   Zoster Vaccines- Shingrix (1 of 2) Never done   COVID-19 Vaccine (3 - Booster) 05/24/2020   TETANUS/TDAP  10/30/2031   Pneumonia Vaccine 78+ Years old  Completed   INFLUENZA VACCINE  Completed   HPV VACCINES  Aged Out     Discussed health benefits of physical activity, and encouraged him to engage in regular exercise appropriate for his age and condition.   1. Encounter for Medicare annual wellness exam  - Lipid panel - TSH - CBC w/Diff/Platelet - Comprehensive Metabolic Panel (CMET)  2. Annual physical exam   3. Essential (primary) hypertension  - Lipid panel - TSH - CBC w/Diff/Platelet - Comprehensive Metabolic Panel (CMET)  4. Other hyperlipidemia  - Lipid panel - TSH - CBC w/Diff/Platelet - Comprehensive Metabolic Panel (CMET)  5. Stage 3 chronic kidney disease, unspecified whether stage 3a or 3b CKD (HCC)  - Lipid panel - TSH - CBC w/Diff/Platelet - Comprehensive Metabolic Panel (CMET)   Return in about 1 year (around 01/20/2023).     I, Wilhemena Durie, MD, have reviewed all documentation for this visit. The documentation on 01/22/22 for the exam, diagnosis, procedures, and orders are all accurate and complete.    Aleksa Collinsworth Cranford Mon, MD  [facility] [phone] (phone) [phone] (fax)  [place]

## 2022-01-21 LAB — CBC WITH DIFFERENTIAL/PLATELET
Basophils Absolute: 0.1 10*3/uL (ref 0.0–0.2)
Basos: 1 %
EOS (ABSOLUTE): 0.4 10*3/uL (ref 0.0–0.4)
Eos: 7 %
Hematocrit: 32.7 % — ABNORMAL LOW (ref 37.5–51.0)
Hemoglobin: 10.6 g/dL — ABNORMAL LOW (ref 13.0–17.7)
Immature Grans (Abs): 0 10*3/uL (ref 0.0–0.1)
Immature Granulocytes: 0 %
Lymphocytes Absolute: 1.2 10*3/uL (ref 0.7–3.1)
Lymphs: 19 %
MCH: 30 pg (ref 26.6–33.0)
MCHC: 32.4 g/dL (ref 31.5–35.7)
MCV: 93 fL (ref 79–97)
Monocytes Absolute: 0.6 10*3/uL (ref 0.1–0.9)
Monocytes: 10 %
Neutrophils Absolute: 4.1 10*3/uL (ref 1.4–7.0)
Neutrophils: 63 %
Platelets: 176 10*3/uL (ref 150–450)
RBC: 3.53 x10E6/uL — ABNORMAL LOW (ref 4.14–5.80)
RDW: 13.9 % (ref 11.6–15.4)
WBC: 6.3 10*3/uL (ref 3.4–10.8)

## 2022-01-21 LAB — LIPID PANEL
Chol/HDL Ratio: 3 ratio (ref 0.0–5.0)
Cholesterol, Total: 124 mg/dL (ref 100–199)
HDL: 41 mg/dL (ref >39)
LDL Chol Calc (NIH): 53 mg/dL (ref 0–99)
Triglycerides: 180 mg/dL — ABNORMAL HIGH (ref 0–149)
VLDL Cholesterol Cal: 30 mg/dL (ref 5–40)

## 2022-01-21 LAB — COMPREHENSIVE METABOLIC PANEL
ALT: 20 IU/L (ref 0–44)
AST: 25 IU/L (ref 0–40)
Albumin/Globulin Ratio: 1.5 (ref 1.2–2.2)
Albumin: 4 g/dL (ref 3.6–4.6)
Alkaline Phosphatase: 102 IU/L (ref 44–121)
BUN/Creatinine Ratio: 18 (ref 10–24)
BUN: 45 mg/dL — ABNORMAL HIGH (ref 8–27)
Bilirubin Total: 0.2 mg/dL (ref 0.0–1.2)
CO2: 20 mmol/L (ref 20–29)
Calcium: 9.4 mg/dL (ref 8.6–10.2)
Chloride: 106 mmol/L (ref 96–106)
Creatinine, Ser: 2.48 mg/dL — ABNORMAL HIGH (ref 0.76–1.27)
Globulin, Total: 2.6 g/dL (ref 1.5–4.5)
Glucose: 113 mg/dL — ABNORMAL HIGH (ref 70–99)
Potassium: 5.7 mmol/L — ABNORMAL HIGH (ref 3.5–5.2)
Sodium: 140 mmol/L (ref 134–144)
Total Protein: 6.6 g/dL (ref 6.0–8.5)
eGFR: 25 mL/min/{1.73_m2} — ABNORMAL LOW (ref >59)

## 2022-01-21 LAB — TSH: TSH: 1.77 u[IU]/mL (ref 0.450–4.500)

## 2022-07-05 ENCOUNTER — Other Ambulatory Visit: Payer: Self-pay | Admitting: Family Medicine

## 2022-08-30 DIAGNOSIS — I2510 Atherosclerotic heart disease of native coronary artery without angina pectoris: Secondary | ICD-10-CM | POA: Diagnosis not present

## 2022-08-30 DIAGNOSIS — N183 Chronic kidney disease, stage 3 unspecified: Secondary | ICD-10-CM | POA: Diagnosis not present

## 2022-08-30 DIAGNOSIS — I10 Essential (primary) hypertension: Secondary | ICD-10-CM | POA: Diagnosis not present

## 2022-09-07 ENCOUNTER — Ambulatory Visit (INDEPENDENT_AMBULATORY_CARE_PROVIDER_SITE_OTHER): Payer: Medicare Other

## 2022-09-07 DIAGNOSIS — Z23 Encounter for immunization: Secondary | ICD-10-CM | POA: Diagnosis not present

## 2023-01-18 ENCOUNTER — Telehealth: Payer: Self-pay | Admitting: Family Medicine

## 2023-01-18 MED ORDER — OMEPRAZOLE 20 MG PO CPDR: DELAYED_RELEASE_CAPSULE | ORAL | 0 refills | Status: AC

## 2023-01-18 NOTE — Telephone Encounter (Signed)
[facility] faxed refill request for the following medications:  omeprazole (PRILOSEC) 20 MG capsule     Please advise.  

## 2023-01-27 ENCOUNTER — Encounter: Payer: Medicare Other | Admitting: Family Medicine

## 2023-02-09 DIAGNOSIS — K219 Gastro-esophageal reflux disease without esophagitis: Secondary | ICD-10-CM | POA: Diagnosis not present

## 2023-02-09 DIAGNOSIS — I129 Hypertensive chronic kidney disease with stage 1 through stage 4 chronic kidney disease, or unspecified chronic kidney disease: Secondary | ICD-10-CM | POA: Diagnosis not present

## 2023-02-09 DIAGNOSIS — I2510 Atherosclerotic heart disease of native coronary artery without angina pectoris: Secondary | ICD-10-CM | POA: Diagnosis not present

## 2023-02-09 DIAGNOSIS — E7800 Pure hypercholesterolemia, unspecified: Secondary | ICD-10-CM | POA: Diagnosis not present

## 2023-07-25 ENCOUNTER — Other Ambulatory Visit: Payer: Self-pay

## 2023-07-25 ENCOUNTER — Emergency Department
Admission: EM | Admit: 2023-07-25 | Discharge: 2023-07-26 | Disposition: A | Discharge status: Home / Self Care | Payer: Medicare Other | Attending: Emergency Medicine | Admitting: Emergency Medicine

## 2023-07-25 DIAGNOSIS — Z951 Presence of aortocoronary bypass graft: Secondary | ICD-10-CM | POA: Insufficient documentation

## 2023-07-25 DIAGNOSIS — N184 Chronic kidney disease, stage 4 (severe): Secondary | ICD-10-CM | POA: Diagnosis not present

## 2023-07-25 DIAGNOSIS — N189 Chronic kidney disease, unspecified: Secondary | ICD-10-CM | POA: Insufficient documentation

## 2023-07-25 DIAGNOSIS — E875 Hyperkalemia: Secondary | ICD-10-CM | POA: Insufficient documentation

## 2023-07-25 DIAGNOSIS — N2581 Secondary hyperparathyroidism of renal origin: Secondary | ICD-10-CM | POA: Diagnosis not present

## 2023-07-25 DIAGNOSIS — I2510 Atherosclerotic heart disease of native coronary artery without angina pectoris: Secondary | ICD-10-CM | POA: Insufficient documentation

## 2023-07-25 DIAGNOSIS — I10 Essential (primary) hypertension: Secondary | ICD-10-CM | POA: Diagnosis not present

## 2023-07-25 DIAGNOSIS — Z7902 Long term (current) use of antithrombotics/antiplatelets: Secondary | ICD-10-CM | POA: Diagnosis not present

## 2023-07-25 DIAGNOSIS — D631 Anemia in chronic kidney disease: Secondary | ICD-10-CM | POA: Diagnosis not present

## 2023-07-25 DIAGNOSIS — R001 Bradycardia, unspecified: Secondary | ICD-10-CM | POA: Insufficient documentation

## 2023-07-25 DIAGNOSIS — Z7982 Long term (current) use of aspirin: Secondary | ICD-10-CM | POA: Insufficient documentation

## 2023-07-25 LAB — CBC
HCT: 28.3 % — ABNORMAL LOW (ref 39.0–52.0)
Hemoglobin: 8.7 g/dL — ABNORMAL LOW (ref 13.0–17.0)
MCH: 29.7 pg (ref 26.0–34.0)
MCHC: 30.7 g/dL (ref 30.0–36.0)
MCV: 96.6 fL (ref 80.0–100.0)
Platelets: 145 10*3/uL — ABNORMAL LOW (ref 150–400)
RBC: 2.93 MIL/uL — ABNORMAL LOW (ref 4.22–5.81)
RDW: 15.1 % (ref 11.5–15.5)
WBC: 6.4 10*3/uL (ref 4.0–10.5)
nRBC: 0 % (ref 0.0–0.2)

## 2023-07-25 LAB — COMPREHENSIVE METABOLIC PANEL
ALT: 16 U/L (ref 0–44)
AST: 21 U/L (ref 15–41)
Albumin: 3.6 g/dL (ref 3.5–5.0)
Alkaline Phosphatase: 78 U/L (ref 38–126)
Anion gap: 6 (ref 5–15)
BUN: 69 mg/dL — ABNORMAL HIGH (ref 8–23)
CO2: 17 mmol/L — ABNORMAL LOW (ref 22–32)
Calcium: 8.9 mg/dL (ref 8.9–10.3)
Chloride: 115 mmol/L — ABNORMAL HIGH (ref 98–111)
Creatinine, Ser: 2.72 mg/dL — ABNORMAL HIGH (ref 0.61–1.24)
GFR, Estimated: 22 mL/min — ABNORMAL LOW (ref >60)
Glucose, Bld: 108 mg/dL — ABNORMAL HIGH (ref 70–99)
Potassium: 5.5 mmol/L — ABNORMAL HIGH (ref 3.5–5.1)
Sodium: 138 mmol/L (ref 135–145)
Total Bilirubin: 0.5 mg/dL (ref 0.3–1.2)
Total Protein: 6.6 g/dL (ref 6.5–8.1)

## 2023-07-25 MED ORDER — SODIUM ZIRCONIUM CYCLOSILICATE 10 G PO PACK
10.0000 g | PACK | Freq: Once | ORAL | Status: AC
  Administered 2023-07-25: 10 g via ORAL
  Filled 2023-07-25: qty 1

## 2023-07-25 MED ORDER — SODIUM CHLORIDE 0.9 % IV BOLUS (SEPSIS)
1000.0000 mL | Freq: Once | INTRAVENOUS | Status: AC
  Administered 2023-07-25: 1000 mL via INTRAVENOUS

## 2023-07-25 NOTE — Discharge Instructions (Addendum)
Your potassium was checked at your nephrologist office and was 6.2.  On recheck in the ED it is 5.5.  This is slightly elevated but it appears that your potassium is normally around 5 on previous blood work although back to 2016.  You do not have any symptoms and you are not having any changes in your EKG.  We have given you IV fluids and medications to help bring your potassium down.  I feel you are safe to be discharged with close follow-up with your PCP or nephrologist to have your potassium rechecked in the next 2-3 days.

## 2023-07-25 NOTE — ED Triage Notes (Signed)
Pt to ED via POV for abnormal labs. Pt had bloodwork done today and he got a call back with a result of potassium of 6.2. pt denies CP, SOB, fevers, N.V, weakness, dizziness.

## 2023-07-25 NOTE — ED Provider Notes (Signed)
[facility] Provider Note    Event Date/Time   First MD Initiated Contact with Patient 07/25/23 2300     (approximate)   History   Abnormal Labs   HPI  KRAIG BONDI is a 89 y.o. male with history of coronary artery disease, chronic kidney disease who presents to the emergency department for concerns for hyperkalemia.  Patient is asymptomatic.  He was lost to follow-up and went to [facility] nephrology for a routine appointment today and had lab work drawn.  Potassium came back at 6.2 and he was called tonight to come to the ED.  He is not having any chest pain, shortness of breath, palpitations, dizziness, vomiting, diarrhea.  States he is feeling fine.   History provided by patient.    History reviewed. No pertinent past medical history.  Past Surgical History:  Procedure Laterality Date   CORONARY ARTERY BYPASS GRAFT      MEDICATIONS:  Prior to Admission medications   Medication Sig Start Date End Date Taking? Authorizing Provider  amLODipine (NORVASC) 5 MG tablet Take by mouth.    [provider]  aspirin 81 MG tablet Take by mouth.    [provider]  atorvastatin (LIPITOR) 40 MG tablet TAKE 1 TABLET(40 MG) BY MOUTH DAILY 06/02/20   Bosie Clos, MD  clopidogrel (PLAVIX) 75 MG tablet Take by mouth.    [provider]  metoprolol succinate (TOPROL-XL) 25 MG 24 hr tablet Take by mouth.    [provider]  omeprazole (PRILOSEC) 20 MG capsule TAKE 1 CAPSULE(20 MG) BY MOUTH DAILY 01/18/23   Bacigalupo, Marzella Schlein, MD  ramipril (ALTACE) 2.5 MG capsule Take by mouth. 03/13/13   [provider]  sodium bicarbonate 650 MG tablet Take 650 mg by mouth 2 (two) times daily. 08/31/19   [provider]  Vitamin D, Cholecalciferol, 50 MCG (2000 UT) CAPS Take by mouth.    [provider]    Physical Exam   Triage Vital Signs: ED Triage Vitals  Encounter Vitals Group     BP 07/25/23 2251 (!) 137/93      Systolic BP Percentile --      Diastolic BP Percentile --      Pulse Rate 07/25/23 2251 (!) 53     Resp 07/25/23 2251 18     Temp 07/25/23 2251 98 F (36.7 C)     Temp Source 07/25/23 2251 Oral     SpO2 07/25/23 2251 99 %     Weight 07/25/23 2249 158 lb (71.7 kg)     Height 07/25/23 2249 5\' 9"  (1.753 m)     Head Circumference --      Peak Flow --      Pain Score 07/25/23 2249 0     Pain Loc --      Pain Education --      Exclude from Growth Chart --     Most recent vital signs: Vitals:   07/25/23 2251  BP: (!) 137/93  Pulse: (!) 53  Resp: 18  Temp: 98 F (36.7 C)  SpO2: 99%    CONSTITUTIONAL: Alert, responds appropriately to questions. Well-appearing; well-nourished HEAD: Normocephalic, atraumatic EYES: Conjunctivae clear, pupils appear equal, sclera nonicteric ENT: normal nose; moist mucous membranes NECK: Supple, normal ROM CARD: RRR; S1 and S2 appreciated RESP: Normal chest excursion without splinting or tachypnea; breath sounds clear and equal bilaterally; no wheezes, no rhonchi, no rales, no hypoxia or respiratory distress, speaking full sentences ABD/GI: Non-distended;  soft, non-tender, no rebound, no guarding, no peritoneal signs BACK: The back appears normal EXT: Normal ROM in all joints; no deformity noted, no edema SKIN: Normal color for age and race; warm; no rash on exposed skin NEURO: Moves all extremities equally, normal speech PSYCH: The patient's mood and manner are appropriate.   ED Results / Procedures / Treatments   LABS: (all labs ordered are listed, but only abnormal results are displayed) Labs Reviewed  CBC - Abnormal; Notable for the following components:      Result Value   RBC 2.93 (*)    Hemoglobin 8.7 (*)    HCT 28.3 (*)    Platelets 145 (*)    All other components within normal limits  COMPREHENSIVE METABOLIC PANEL - Abnormal; Notable for the following components:   Potassium 5.5 (*)    Chloride 115 (*)    CO2 17 (*)     Glucose, Bld 108 (*)    BUN 69 (*)    Creatinine, Ser 2.72 (*)    GFR, Estimated 22 (*)    All other components within normal limits     EKG:  EKG Interpretation Date/Time:  Monday July 25 2023 22:51:00 EDT Ventricular Rate:  53 PR Interval:  192 QRS Duration:  94 QT Interval:  404 QTC Calculation: 379 R Axis:   6  Text Interpretation: Sinus bradycardia with sinus arrhythmia Moderate voltage criteria for LVH, may be normal variant ( R in aVL , Sokolow-Lyon ) Borderline ECG When compared with ECG of 06-Dec-2013 15:30, RSR' pattern in V1 is no longer Present Non-specific change in ST segment in Lateral leads T wave inversion no longer evident in Lateral leads Confirmed by Rochele Raring [phone]) on 07/25/2023 11:06:15 PM         RADIOLOGY: My personal review and interpretation of imaging:    I have personally reviewed all radiology reports.   No results found.   PROCEDURES:  Critical Care performed: No     .1-3 Lead EKG Interpretation  Performed by: , Layla Maw, DO Authorized by: , Layla Maw, DO     Interpretation: normal     ECG rate:  53   ECG rate assessment: bradycardic     Rhythm: sinus bradycardia     Ectopy: none     Conduction: normal       IMPRESSION / MDM / ASSESSMENT AND PLAN / ED COURSE  I reviewed the triage vital signs and the nursing notes.    Patient here for asymptomatic hyperkalemia in the setting of chronic kidney disease.  The patient is on the cardiac monitor to evaluate for evidence of arrhythmia and/or significant heart rate changes.   DIFFERENTIAL DIAGNOSIS (includes but not limited to):   Hyperkalemia, no EKG changes, likely secondary to chronic kidney disease   Patient's presentation is most consistent with acute presentation with potential threat to life or bodily function.   PLAN: Will repeat blood work today.  Will continue to keep on cardiac monitoring.  EKG shows no hyperacute T waves, prolonged QT  interval.   MEDICATIONS GIVEN IN ED: Medications  sodium chloride 0.9 % bolus 1,000 mL (1,000 mLs Intravenous New Bag/Given 07/25/23 2340)  sodium zirconium cyclosilicate (LOKELMA) packet 10 g (10 g Oral Given 07/25/23 2340)     ED COURSE: Patient's hemoglobin is 8.7.  No symptoms of GI bleed.  Likely secondary to chronic kidney disease.  Potassium on recheck today is 5.5.  It appears that this is near his baseline.  He  normally runs around 5-5.1 and had a 5.7 in February 2023.  Creatinine slightly elevated from his last at 2.7 too.  He does have a metabolic acidosis likely from uremia.  Will hydrate patient and give Lokelma and keep on cardiac monitoring but given hyperkalemia is very mild and near his baseline and he is feeling no symptoms, anticipate discharge home with close follow-up with his PCP or nephrologist to have potassium rechecked.  Patient is comfortable with this plan and would prefer discharge over admission to the hospital.  He does have some mild bradycardia here but is normotensive.  It appears he normally runs in the 40s to 50s and this has been documented multiple times previously.  He is not symptomatic from it.  At this time, I do not feel there is any life-threatening condition present. I reviewed all nursing notes, vitals, pertinent previous records.  All lab and urine results, EKGs, imaging ordered have been independently reviewed and interpreted by myself.  I reviewed all available radiology reports from any imaging ordered this visit.  Based on my assessment, I feel the patient is safe to be discharged home without further emergent workup and can continue workup as an outpatient as needed. Discussed all findings, treatment plan as well as usual and customary return precautions.  They verbalize understanding and are comfortable with this plan.  Outpatient follow-up has been provided as needed.  All questions have been answered.    CONSULTS: Admission considered but  patient's hyperkalemia is mild and he is not having any symptoms or EKG changes.   OUTSIDE RECORDS REVIEWED: Reviewed recent nephrology note from Dr. Sheria Lang at [facility].       FINAL CLINICAL IMPRESSION(S) / ED DIAGNOSES   Final diagnoses:  Hyperkalemia     Rx / DC Orders   ED Discharge Orders     None        Note:  This document was prepared using Dragon voice recognition software and may include unintentional dictation errors.   , Layla Maw, DO 07/25/23 2342

## 2023-07-25 NOTE — ED Notes (Signed)
Pt placed on monitor. Primary RN amy notified about pts critical lab and his arrival

## 2023-07-27 DIAGNOSIS — I2510 Atherosclerotic heart disease of native coronary artery without angina pectoris: Secondary | ICD-10-CM | POA: Diagnosis not present

## 2023-07-27 DIAGNOSIS — N1832 Chronic kidney disease, stage 3b: Secondary | ICD-10-CM | POA: Diagnosis not present

## 2023-07-27 DIAGNOSIS — E875 Hyperkalemia: Secondary | ICD-10-CM | POA: Diagnosis not present

## 2023-07-27 DIAGNOSIS — E7800 Pure hypercholesterolemia, unspecified: Secondary | ICD-10-CM | POA: Diagnosis not present

## 2023-08-02 ENCOUNTER — Telehealth: Payer: Self-pay

## 2023-08-02 DIAGNOSIS — E875 Hyperkalemia: Secondary | ICD-10-CM | POA: Diagnosis not present

## 2023-08-02 NOTE — Telephone Encounter (Signed)
Transition Care Management Follow-up Telephone Call Date of discharge and from where: 07/26/2023 [facility] How have you been since you were released from the hospital? Patient stated he is feeling fine. Any questions or concerns? No  Items Reviewed: Did the pt receive and understand the discharge instructions provided? Yes  Medications obtained and verified?  No medication prescribed. Other? No  Any new allergies since your discharge? No  Dietary orders reviewed? Yes Do you have support at home? Yes   Follow up appointments reviewed:  PCP Hospital f/u appt confirmed? Yes  Scheduled to see Richard L. Sullivan Lone, MD on 08/25/2023 @ [facility]. Specialist Hospital f/u appt confirmed? Yes  Scheduled to see Jill Side, NP on 08/18/2023 @ [facility]. Are transportation arrangements needed? No  If their condition worsens, is the pt aware to call PCP or go to the Emergency Dept.? Yes Was the patient provided with contact information for the PCP's office or ED? Yes Was to pt encouraged to call back with questions or concerns? Yes  Azayla Polo [facility]  [facility] Population [facility] Community Resource Care Guide   [email or web address]  ?? [phone]   Website: [email or web address]  [email or web address]

## 2023-08-25 DIAGNOSIS — I2510 Atherosclerotic heart disease of native coronary artery without angina pectoris: Secondary | ICD-10-CM | POA: Diagnosis not present

## 2023-08-25 DIAGNOSIS — I10 Essential (primary) hypertension: Secondary | ICD-10-CM | POA: Diagnosis not present

## 2023-08-25 DIAGNOSIS — R600 Localized edema: Secondary | ICD-10-CM | POA: Diagnosis not present

## 2023-08-25 DIAGNOSIS — I739 Peripheral vascular disease, unspecified: Secondary | ICD-10-CM | POA: Diagnosis not present

## 2023-08-31 DIAGNOSIS — I2510 Atherosclerotic heart disease of native coronary artery without angina pectoris: Secondary | ICD-10-CM | POA: Diagnosis not present

## 2023-08-31 DIAGNOSIS — R600 Localized edema: Secondary | ICD-10-CM | POA: Diagnosis not present

## 2023-08-31 DIAGNOSIS — I739 Peripheral vascular disease, unspecified: Secondary | ICD-10-CM | POA: Diagnosis not present

## 2023-08-31 DIAGNOSIS — I10 Essential (primary) hypertension: Secondary | ICD-10-CM | POA: Diagnosis not present

## 2023-09-02 DIAGNOSIS — Z23 Encounter for immunization: Secondary | ICD-10-CM | POA: Diagnosis not present

## 2023-09-09 DIAGNOSIS — R600 Localized edema: Secondary | ICD-10-CM | POA: Diagnosis not present

## 2023-11-15 DIAGNOSIS — Z0000 Encounter for general adult medical examination without abnormal findings: Secondary | ICD-10-CM | POA: Diagnosis not present

## 2023-11-15 DIAGNOSIS — I129 Hypertensive chronic kidney disease with stage 1 through stage 4 chronic kidney disease, or unspecified chronic kidney disease: Secondary | ICD-10-CM | POA: Diagnosis not present

## 2023-11-15 DIAGNOSIS — N1832 Chronic kidney disease, stage 3b: Secondary | ICD-10-CM | POA: Diagnosis not present

## 2023-11-15 DIAGNOSIS — D649 Anemia, unspecified: Secondary | ICD-10-CM | POA: Diagnosis not present

## 2023-11-15 DIAGNOSIS — E538 Deficiency of other specified B group vitamins: Secondary | ICD-10-CM | POA: Diagnosis not present

## 2023-11-15 DIAGNOSIS — D631 Anemia in chronic kidney disease: Secondary | ICD-10-CM | POA: Diagnosis not present

## 2024-01-31 ENCOUNTER — Other Ambulatory Visit: Payer: Self-pay | Admitting: Gastroenterology

## 2024-01-31 DIAGNOSIS — D509 Iron deficiency anemia, unspecified: Secondary | ICD-10-CM

## 2024-02-15 ENCOUNTER — Ambulatory Visit
Admission: RE | Admit: 2024-02-15 | Discharge: 2024-02-15 | Disposition: A | Discharge status: Home / Self Care | Payer: Medicare Other | Source: Ambulatory Visit | Attending: Gastroenterology | Admitting: Gastroenterology

## 2024-02-15 DIAGNOSIS — D509 Iron deficiency anemia, unspecified: Secondary | ICD-10-CM | POA: Diagnosis present

## 2024-02-15 DIAGNOSIS — Q6102 Congenital multiple renal cysts: Secondary | ICD-10-CM | POA: Insufficient documentation

## 2024-02-15 DIAGNOSIS — K429 Umbilical hernia without obstruction or gangrene: Secondary | ICD-10-CM | POA: Insufficient documentation

## 2024-02-15 DIAGNOSIS — I700 Atherosclerosis of aorta: Secondary | ICD-10-CM | POA: Diagnosis not present

## 2024-02-15 DIAGNOSIS — R531 Weakness: Secondary | ICD-10-CM | POA: Diagnosis not present

## 2024-02-15 DIAGNOSIS — K8020 Calculus of gallbladder without cholecystitis without obstruction: Secondary | ICD-10-CM | POA: Diagnosis not present

## 2024-02-27 ENCOUNTER — Encounter: Payer: Self-pay | Admitting: Oncology

## 2024-02-27 ENCOUNTER — Inpatient Hospital Stay

## 2024-02-27 ENCOUNTER — Inpatient Hospital Stay: Attending: Oncology | Admitting: Oncology

## 2024-02-27 VITALS — BP 142/70 | HR 63 | Temp 97.0°F | Resp 19 | Ht 69.0 in | Wt 158.4 lb

## 2024-02-27 DIAGNOSIS — I130 Hypertensive heart and chronic kidney disease with heart failure and stage 1 through stage 4 chronic kidney disease, or unspecified chronic kidney disease: Secondary | ICD-10-CM | POA: Diagnosis present

## 2024-02-27 DIAGNOSIS — D509 Iron deficiency anemia, unspecified: Secondary | ICD-10-CM | POA: Diagnosis not present

## 2024-02-27 DIAGNOSIS — I509 Heart failure, unspecified: Secondary | ICD-10-CM

## 2024-02-27 DIAGNOSIS — D631 Anemia in chronic kidney disease: Secondary | ICD-10-CM | POA: Insufficient documentation

## 2024-02-27 DIAGNOSIS — N184 Chronic kidney disease, stage 4 (severe): Secondary | ICD-10-CM | POA: Insufficient documentation

## 2024-02-28 ENCOUNTER — Encounter: Payer: Self-pay | Admitting: Oncology

## 2024-03-01 ENCOUNTER — Encounter: Payer: Self-pay | Admitting: Oncology

## 2024-03-01 NOTE — Progress Notes (Signed)
 Hematology/Oncology Consult note [facility] Telephone:([phone] [phone]  Patient Care Team: Bosie Clos, MD as PCP - General (Family Medicine)   Name of the patient: Eric Mccall  [id number]  [birth year]    Reason for referral-anemia of chronic kidney disease   Referring physician-Dr. Sullivan Lone  Date of visit: 03/01/24   History of presenting illness- Patient is an 89 year old male with a past medical history significant for stage 4 CKD hypertension hyperlipidemia among other medical problems who has been referred for anemia.  Patient's hemoglobin was around 11 up until 2022.  It drifted down to 10.5 in September 2023 and over the last 6 months it has been mostly between 7.5-8.5.  Iron studies showed a low ferritin of 45 in February 2025.  He has been scheduled for IV iron infusions through nephrology coming up soon.  He reports ongoing fatigue.  Denies any blood loss in his stool or urine.  ECOG PS- 1  Pain scale- 0   Review of systems- Review of Systems  Constitutional:  Positive for malaise/fatigue. Negative for chills, fever and weight loss.  HENT:  Negative for congestion, ear discharge and nosebleeds.   Eyes:  Negative for blurred vision.  Respiratory:  Negative for cough, hemoptysis, sputum production, shortness of breath and wheezing.   Cardiovascular:  Negative for chest pain, palpitations, orthopnea and claudication.  Gastrointestinal:  Negative for abdominal pain, blood in stool, constipation, diarrhea, heartburn, melena, nausea and vomiting.  Genitourinary:  Negative for dysuria, flank pain, frequency, hematuria and urgency.  Musculoskeletal:  Negative for back pain, joint pain and myalgias.  Skin:  Negative for rash.  Neurological:  Negative for dizziness, tingling, focal weakness, seizures, weakness and headaches.  Endo/Heme/Allergies:  Does not bruise/bleed easily.  Psychiatric/Behavioral:  Negative for depression and  suicidal ideas. The patient does not have insomnia.     No Known Allergies  Patient Active Problem List   Diagnosis Date Noted   Popliteal artery aneurysm, bilateral (HCC) 03/22/2019   Red blood cell antibody positive 10/03/2017   Sternal wound dehiscence 09/27/2017   Abdominal aortic aneurysm (AAA) (HCC) 11/13/2015   HLD (hyperlipidemia) 11/13/2015   Heart & renal disease, hypertensive, with heart failure (HCC) 11/13/2015   Irregular cardiac rhythm 11/13/2015   Cardiac ischemia 11/13/2015   Cataract, nuclear 11/13/2015   Chronic kidney disease (CKD), stage III (moderate) (HCC) 07/07/2015   Essential (primary) hypertension 07/07/2015   Benign fibroma of prostate 09/09/2014   NS (nuclear sclerosis) 09/06/2013   Keratoconus of both eyes 09/06/2013   Prostate nodule 09/06/2013   Arteriosclerosis of coronary artery 06/27/2012   CAD (coronary artery disease) 06/27/2012     Past Medical History:  Diagnosis Date   GERD (gastroesophageal reflux disease)    Hypertension      Past Surgical History:  Procedure Laterality Date   CORONARY ARTERY BYPASS GRAFT      Social History   Socioeconomic History   Marital status: Married    Spouse name: Kier Smead   Number of children: 1   Years of education: Not on file   Highest education level: Not on file  Occupational History   Not on file  Tobacco Use   Smoking status: Never   Smokeless tobacco: Never  Vaping Use   Vaping status: Never Used  Substance and Sexual Activity   Alcohol use: Yes    Comment: monthly or less, 1 to 2 drinks at a time then   Drug use: Never   Sexual activity:  Not Currently  Other Topics Concern   Not on file  Social History Narrative   Patient has 1 daughter who lives in [place] and has 2 grandkids! His oldest grandchild will graduate in Spring of 2025, his youngest grandchild is currently a Printmaker at [employer] who will graduate in 2027-2028!   Social Drivers of Corporate investment banker  Strain: Low Risk  (01/30/2024)   Received from [facility]   Overall Financial Resource Strain (CARDIA)    Difficulty of Paying Living Expenses: Not hard at all  Food Insecurity: No Food Insecurity (02/27/2024)   Hunger Vital Sign    Worried About Running Out of Food in the Last Year: Never true    Ran Out of Food in the Last Year: Never true  Transportation Needs: No Transportation Needs (02/27/2024)   PRAPARE - Administrator, Civil Service (Medical): No    Lack of Transportation (Non-Medical): No  Physical Activity: Not on file  Stress: Not on file  Social Connections: Not on file  Intimate Partner Violence: Not At Risk (02/27/2024)   Humiliation, Afraid, Rape, and Kick questionnaire    Fear of Current or Ex-Partner: No    Emotionally Abused: No    Physically Abused: No    Sexually Abused: No     Family History  Problem Relation Age of Onset   Cancer Mother    Depression Mother    Heart disease Father    Heart attack Father    Breast cancer Sister      Current Outpatient Medications:    amLODipine (NORVASC) 5 MG tablet, Take by mouth., Disp: , Rfl:    aspirin 81 MG tablet, Take by mouth., Disp: , Rfl:    atorvastatin (LIPITOR) 40 MG tablet, TAKE 1 TABLET(40 MG) BY MOUTH DAILY, Disp: 90 tablet, Rfl: 3   clopidogrel (PLAVIX) 75 MG tablet, Take by mouth., Disp: , Rfl:    ferrous sulfate 325 (65 FE) MG EC tablet, Take by mouth., Disp: , Rfl:    furosemide (LASIX) 20 MG tablet, Take by mouth., Disp: , Rfl:    JARDIANCE 25 MG TABS tablet, Take 25 mg by mouth daily., Disp: , Rfl:    metoprolol succinate (TOPROL-XL) 25 MG 24 hr tablet, Take by mouth., Disp: , Rfl:    nitroGLYCERIN (NITROSTAT) 0.4 MG SL tablet, Place under the tongue., Disp: , Rfl:    omeprazole (PRILOSEC) 20 MG capsule, TAKE 1 CAPSULE(20 MG) BY MOUTH DAILY (Patient taking differently: 20 mg 2 (two) times daily before a meal. TAKE 1 CAPSULE(20 MG) BY MOUTH DAILY), Disp: 90 capsule, Rfl:  0   ramipril (ALTACE) 5 MG capsule, Take 1 capsule by mouth daily., Disp: , Rfl:    sodium bicarbonate 650 MG tablet, Take 650 mg by mouth 2 (two) times daily., Disp: , Rfl:    Vitamin D, Cholecalciferol, 50 MCG (2000 UT) CAPS, Take by mouth., Disp: , Rfl:    Physical exam:  Vitals:   02/27/24 1459  BP: (!) 142/70  Pulse: 63  Resp: 19  Temp: (!) 97 F (36.1 C)  TempSrc: Tympanic  SpO2: 100%  Weight: 158 lb 6.4 oz (71.8 kg)  Height: 5\' 9"  (1.753 m)   Physical Exam Cardiovascular:     Rate and Rhythm: Normal rate and regular rhythm.     Heart sounds: Normal heart sounds.  Pulmonary:     Effort: Pulmonary effort is normal.     Breath sounds: Normal breath sounds.  Abdominal:     General: Bowel sounds are normal.     Palpations: Abdomen is soft.  Skin:    General: Skin is warm and dry.  Neurological:     Mental Status: He is alert and oriented to person, place, and time.           Latest Ref Rng & Units 07/25/2023   11:00 PM  CMP  Glucose 70 - 99 mg/dL 161   BUN 8 - 23 mg/dL 69   Creatinine 0.96 - 1.24 mg/dL 0.45   Sodium 409 - 811 mmol/L 138   Potassium 3.5 - 5.1 mmol/L 5.5   Chloride 98 - 111 mmol/L 115   CO2 22 - 32 mmol/L 17   Calcium 8.9 - 10.3 mg/dL 8.9   Total Protein 6.5 - 8.1 g/dL 6.6   Total Bilirubin 0.3 - 1.2 mg/dL 0.5   Alkaline Phos 38 - 126 U/L 78   AST 15 - 41 U/L 21   ALT 0 - 44 U/L 16       Latest Ref Rng & Units 07/25/2023   11:00 PM  CBC  WBC 4.0 - 10.5 K/uL 6.4   Hemoglobin 13.0 - 17.0 g/dL 8.7   Hematocrit 91.4 - 52.0 % 28.3   Platelets 150 - 400 K/uL 145     No images are attached to the encounter.  No results found.  Assessment and plan- Patient is a 89 y.o. male referred for anemia of stage IV kidney disease  Patient's hemoglobin has drifted down from 10.5 in 2023 presently with 2 between 7.5-8.5.  Ferritin levels were low at 45.  In patients with chronic kidney disease and would like to keep the ferritin closer to 100.  He is  already scheduled for IV ironThrough his nephrology office and therefore does not need to be scheduled to our cancer center.  I will plan to get a complete anemia workup including CBC CMP ferritin and iron studies B12 folate TSH reticulocyte count myeloma panel and serum free light chains in about 10 weeks and I will see him thereafter   Thank you for this kind referral and the opportunity to participate in the care of this  Patient   Visit Diagnosis 1. Anemia of chronic kidney failure, stage 4 (severe) (HCC)   2. Iron deficiency anemia, unspecified iron deficiency anemia type     Dr. Owens Shark, MD, MPH [facility] [phone] 03/01/2024

## 2024-05-11 ENCOUNTER — Inpatient Hospital Stay: Admitting: Oncology

## 2024-05-11 ENCOUNTER — Inpatient Hospital Stay

## 2024-05-30 ENCOUNTER — Inpatient Hospital Stay: Attending: Oncology

## 2024-05-30 ENCOUNTER — Inpatient Hospital Stay: Admitting: Oncology

## 2024-05-30 ENCOUNTER — Encounter: Payer: Self-pay | Admitting: Oncology

## 2024-05-30 VITALS — BP 132/60 | HR 45 | Temp 97.1°F | Resp 19 | Ht 69.0 in | Wt 159.1 lb

## 2024-05-30 DIAGNOSIS — D631 Anemia in chronic kidney disease: Secondary | ICD-10-CM

## 2024-05-30 DIAGNOSIS — D509 Iron deficiency anemia, unspecified: Secondary | ICD-10-CM

## 2024-05-30 DIAGNOSIS — N184 Chronic kidney disease, stage 4 (severe): Secondary | ICD-10-CM | POA: Insufficient documentation

## 2024-05-30 DIAGNOSIS — I129 Hypertensive chronic kidney disease with stage 1 through stage 4 chronic kidney disease, or unspecified chronic kidney disease: Secondary | ICD-10-CM | POA: Diagnosis present

## 2024-05-30 LAB — CBC WITH DIFFERENTIAL/PLATELET
Abs Immature Granulocytes: 0.02 10*3/uL (ref 0.00–0.07)
Basophils Absolute: 0 10*3/uL (ref 0.0–0.1)
Basophils Relative: 1 %
Eosinophils Absolute: 0.4 10*3/uL (ref 0.0–0.5)
Eosinophils Relative: 6 %
HCT: 39.3 % (ref 39.0–52.0)
Hemoglobin: 12.8 g/dL — ABNORMAL LOW (ref 13.0–17.0)
Immature Granulocytes: 0 %
Lymphocytes Relative: 16 %
Lymphs Abs: 1 10*3/uL (ref 0.7–4.0)
MCH: 31.5 pg (ref 26.0–34.0)
MCHC: 32.6 g/dL (ref 30.0–36.0)
MCV: 96.8 fL (ref 80.0–100.0)
Monocytes Absolute: 0.4 10*3/uL (ref 0.1–1.0)
Monocytes Relative: 6 %
Neutro Abs: 4.6 10*3/uL (ref 1.7–7.7)
Neutrophils Relative %: 71 %
Platelets: 167 10*3/uL (ref 150–400)
RBC: 4.06 MIL/uL — ABNORMAL LOW (ref 4.22–5.81)
RDW: 13.2 % (ref 11.5–15.5)
WBC: 6.4 10*3/uL (ref 4.0–10.5)
nRBC: 0 % (ref 0.0–0.2)

## 2024-05-30 LAB — VITAMIN B12: Vitamin B-12: 1215 pg/mL — ABNORMAL HIGH (ref 180–914)

## 2024-05-30 LAB — COMPREHENSIVE METABOLIC PANEL WITH GFR
ALT: 16 U/L (ref 0–44)
AST: 20 U/L (ref 15–41)
Albumin: 3.8 g/dL (ref 3.5–5.0)
Alkaline Phosphatase: 91 U/L (ref 38–126)
Anion gap: 7 (ref 5–15)
BUN: 53 mg/dL — ABNORMAL HIGH (ref 8–23)
CO2: 24 mmol/L (ref 22–32)
Calcium: 9.2 mg/dL (ref 8.9–10.3)
Chloride: 108 mmol/L (ref 98–111)
Creatinine, Ser: 2.64 mg/dL — ABNORMAL HIGH (ref 0.61–1.24)
GFR, Estimated: 23 mL/min — ABNORMAL LOW (ref >60)
Glucose, Bld: 108 mg/dL — ABNORMAL HIGH (ref 70–99)
Potassium: 5.1 mmol/L (ref 3.5–5.1)
Sodium: 139 mmol/L (ref 135–145)
Total Bilirubin: 0.9 mg/dL (ref 0.0–1.2)
Total Protein: 7.4 g/dL (ref 6.5–8.1)

## 2024-05-30 LAB — IRON AND TIBC
Iron: 109 ug/dL (ref 45–182)
Saturation Ratios: 36 % (ref 17.9–39.5)
TIBC: 301 ug/dL (ref 250–450)
UIBC: 192 ug/dL

## 2024-05-30 LAB — RETICULOCYTES
Immature Retic Fract: 4.8 % (ref 2.3–15.9)
RBC.: 4.08 MIL/uL — ABNORMAL LOW (ref 4.22–5.81)
Retic Count, Absolute: 59.2 10*3/uL (ref 19.0–186.0)
Retic Ct Pct: 1.5 % (ref 0.4–3.1)

## 2024-05-30 LAB — TSH: TSH: 2.46 u[IU]/mL (ref 0.350–4.500)

## 2024-05-30 LAB — FERRITIN: Ferritin: 156 ng/mL (ref 24–336)

## 2024-05-30 LAB — LACTATE DEHYDROGENASE: LDH: 64 U/L — ABNORMAL LOW (ref 98–192)

## 2024-05-30 LAB — FOLATE: Folate: 16.3 ng/mL (ref >5.9)

## 2024-05-30 NOTE — Progress Notes (Signed)
 Hematology/Oncology Consult note [facility]  [phone] [phone]  Patient Care Team: Nikki Barters, MD as PCP - General (Family Medicine) Avonne Boettcher, MD as Consulting Physician (Oncology)   Name of the patient: Eric Mccall  [id number]  1934-07-26   Date of visit: 05/30/24  Diagnosis-anemia of chronic kidney disease  Chief complaint/ Reason for visit-routine follow-up of anemia  Heme/Onc history:  Patient is an 89 year old male with a past medical history significant for stage 4 CKD hypertension hyperlipidemia among other medical problems who has been referred for anemia.  Patient's hemoglobin was around 11 up until 2022.  It drifted down to 10.5 in September 2023 and over the last 6 months it has been mostly between 7.5-8.5.  Iron studies showed a low ferritin of 45 in February 2025.  He has been scheduled for IV iron infusions through nephrology coming up soon.  He reports ongoing fatigue.  Denies any blood loss in his stool or urine.  Patient received IV iron with nephrology in March 2025    Interval history-patient reports feeling better after receiving IV iron.  Energy levels have improved  ECOG PS- 1 Pain scale- 0   Review of systems- Review of Systems  Constitutional:  Negative for chills, fever, malaise/fatigue and weight loss.  HENT:  Negative for congestion, ear discharge and nosebleeds.   Eyes:  Negative for blurred vision.  Respiratory:  Negative for cough, hemoptysis, sputum production, shortness of breath and wheezing.   Cardiovascular:  Negative for chest pain, palpitations, orthopnea and claudication.  Gastrointestinal:  Negative for abdominal pain, blood in stool, constipation, diarrhea, heartburn, melena, nausea and vomiting.  Genitourinary:  Negative for dysuria, flank pain, frequency, hematuria and urgency.  Musculoskeletal:  Negative for back pain, joint pain and myalgias.  Skin:  Negative for  rash.  Neurological:  Negative for dizziness, tingling, focal weakness, seizures, weakness and headaches.  Endo/Heme/Allergies:  Does not bruise/bleed easily.  Psychiatric/Behavioral:  Negative for depression and suicidal ideas. The patient does not have insomnia.       No Known Allergies   Past Medical History:  Diagnosis Date   GERD (gastroesophageal reflux disease)    Hypertension      Past Surgical History:  Procedure Laterality Date   CORONARY ARTERY BYPASS GRAFT      Social History   Socioeconomic History   Marital status: Married    Spouse name: Azan Maneri   Number of children: 1   Years of education: Not on file   Highest education level: Not on file  Occupational History   Not on file  Tobacco Use   Smoking status: Never   Smokeless tobacco: Never  Vaping Use   Vaping status: Never Used  Substance and Sexual Activity   Alcohol use: Yes    Comment: monthly or less, 1 to 2 drinks at a time then   Drug use: Never   Sexual activity: Not Currently  Other Topics Concern   Not on file  Social History Narrative   Patient has 1 daughter who lives in [place] and has 2 grandkids! His oldest grandchild will graduate in Spring of 2025, his youngest grandchild is currently a Printmaker at [employer] who will graduate in 2027-2028!   Social Drivers of Corporate investment banker Strain: Low Risk  (05/18/2024)   Received from [facility]   Overall Financial Resource Strain (CARDIA)    Difficulty of Paying Living Expenses: Not hard at all  Food Insecurity: No Food Insecurity (05/18/2024)   Received from [facility]   Hunger Vital Sign    Within the past 12 months, you worried that your food would run out before you got the money to buy more.: Never true    Within the past 12 months, the food you bought just didn't last and you didn't have money to get more.: Never true  Transportation Needs: No Transportation Needs (05/18/2024)   Received  from [facility] - Transportation    In the past 12 months, has lack of transportation kept you from medical appointments or from getting medications?: No    Lack of Transportation (Non-Medical): No  Physical Activity: Not on file  Stress: Not on file  Social Connections: Not on file  Intimate Partner Violence: Not At Risk (02/27/2024)   Humiliation, Afraid, Rape, and Kick questionnaire    Fear of Current or Ex-Partner: No    Emotionally Abused: No    Physically Abused: No    Sexually Abused: No    Family History  Problem Relation Age of Onset   Cancer Mother    Depression Mother    Heart disease Father    Heart attack Father    Breast cancer Sister      Current Outpatient Medications:    amLODipine (NORVASC) 5 MG tablet, Take by mouth., Disp: , Rfl:    aspirin 81 MG tablet, Take by mouth., Disp: , Rfl:    atorvastatin  (LIPITOR) 40 MG tablet, TAKE 1 TABLET(40 MG) BY MOUTH DAILY, Disp: 90 tablet, Rfl: 3   carvedilol (COREG) 6.25 MG tablet, Take 6.25 mg by mouth 2 (two) times daily., Disp: , Rfl:    clopidogrel (PLAVIX) 75 MG tablet, Take by mouth., Disp: , Rfl:    ferrous sulfate 325 (65 FE) MG EC tablet, Take by mouth., Disp: , Rfl:    furosemide (LASIX) 20 MG tablet, Take by mouth., Disp: , Rfl:    JARDIANCE 25 MG TABS tablet, Take 25 mg by mouth daily., Disp: , Rfl:    metoprolol succinate (TOPROL-XL) 25 MG 24 hr tablet, Take by mouth., Disp: , Rfl:    nitroGLYCERIN (NITROSTAT) 0.4 MG SL tablet, Place under the tongue., Disp: , Rfl:    omeprazole  (PRILOSEC) 20 MG capsule, TAKE 1 CAPSULE(20 MG) BY MOUTH DAILY (Patient taking differently: 20 mg 2 (two) times daily before a meal. TAKE 1 CAPSULE(20 MG) BY MOUTH DAILY), Disp: 90 capsule, Rfl: 0   ramipril (ALTACE) 5 MG capsule, Take 1 capsule by mouth daily., Disp: , Rfl:    sodium bicarbonate 650 MG tablet, Take 650 mg by mouth 2 (two) times daily., Disp: , Rfl:    Vitamin D, Cholecalciferol, 50 MCG  (2000 UT) CAPS, Take by mouth., Disp: , Rfl:   Physical exam:  Vitals:   05/30/24 1129  BP: 132/60  Pulse: (!) 45  Resp: 19  Temp: (!) 97.1 F (36.2 C)  TempSrc: Tympanic  SpO2: 100%  Weight: 159 lb 1.6 oz (72.2 kg)  Height: 5' 9 (1.753 m)   Physical Exam  Cardiovascular:     Rate and Rhythm: Normal rate and regular rhythm.     Heart sounds: Normal heart sounds.  Pulmonary:     Effort: Pulmonary effort is normal.     Breath sounds: Normal breath sounds.  Abdominal:     General: Bowel sounds are normal.   Skin:    General: Skin is warm and dry.  Neurological:     Mental Status: He is alert and oriented to person, place, and time.      I have personally reviewed labs listed below:    Latest Ref Rng & Units 05/30/2024   11:07 AM  CMP  Glucose 70 - 99 mg/dL 161   BUN 8 - 23 mg/dL 53   Creatinine 0.96 - 1.24 mg/dL 0.45   Sodium 409 - 811 mmol/L 139   Potassium 3.5 - 5.1 mmol/L 5.1   Chloride 98 - 111 mmol/L 108   CO2 22 - 32 mmol/L 24   Calcium  8.9 - 10.3 mg/dL 9.2   Total Protein 6.5 - 8.1 g/dL 7.4   Total Bilirubin 0.0 - 1.2 mg/dL 0.9   Alkaline Phos 38 - 126 U/L 91   AST 15 - 41 U/L 20   ALT 0 - 44 U/L 16       Latest Ref Rng & Units 05/30/2024   11:07 AM  CBC  WBC 4.0 - 10.5 K/uL 6.4   Hemoglobin 13.0 - 17.0 g/dL 91.4   Hematocrit 78.2 - 52.0 % 39.3   Platelets 150 - 400 K/uL 167       Assessment and plan- Patient is a 89 y.o. male here for routine follow-up of anemia likely multifactorial secondary to iron deficiency as well as anemia of chronic kidney disease  Patient's hemoglobin had drifted down to 8 when I had seen him in March 2025.  After receiving IV iron it is up to 12.8 today.  Iron studies show ferritin level of 156 and iron saturation of 36%.  He therefore does not require any IV iron at this time.  TSH folate are normal.  Myeloma panel and B12 levels are pending.  Since patient is following up with nephrology and receiving IV iron through  them he does not require any follow-up with me at this time.  Patient can be referred to us  in the future if questions or concerns arise   Visit Diagnosis 1. Iron deficiency anemia, unspecified iron deficiency anemia type      Dr. Seretha Dance, MD, MPH [facility] at [facility] [phone] 05/30/2024 3:42 PM

## 2024-05-31 LAB — KAPPA/LAMBDA LIGHT CHAINS
Kappa free light chain: 68.6 mg/L — ABNORMAL HIGH (ref 3.3–19.4)
Kappa, lambda light chain ratio: 1.52 (ref 0.26–1.65)
Lambda free light chains: 45 mg/L — ABNORMAL HIGH (ref 5.7–26.3)

## 2024-06-01 LAB — HAPTOGLOBIN: Haptoglobin: 229 mg/dL (ref 38–329)

## 2024-06-04 LAB — MULTIPLE MYELOMA PANEL, SERUM
Albumin SerPl Elph-Mcnc: 3.5 g/dL (ref 2.9–4.4)
Albumin/Glob SerPl: 1.2 (ref 0.7–1.7)
Alpha 1: 0.2 g/dL (ref 0.0–0.4)
Alpha2 Glob SerPl Elph-Mcnc: 0.9 g/dL (ref 0.4–1.0)
B-Globulin SerPl Elph-Mcnc: 0.8 g/dL (ref 0.7–1.3)
Gamma Glob SerPl Elph-Mcnc: 1 g/dL (ref 0.4–1.8)
Globulin, Total: 3 g/dL (ref 2.2–3.9)
IgA: 136 mg/dL (ref 61–437)
IgG (Immunoglobin G), Serum: 1049 mg/dL (ref 603–1613)
IgM (Immunoglobulin M), Srm: 84 mg/dL (ref 15–143)
Total Protein ELP: 6.5 g/dL (ref 6.0–8.5)
